# Patient Record
Sex: Female | Born: 1990 | Race: White | Hispanic: No | Marital: Married | Smoking: Current every day smoker
Health system: Southern US, Community
[De-identification: ages and names within clinical notes are randomized; demographics above are authoritative.]

## PROBLEM LIST (undated history)

## (undated) ENCOUNTER — Inpatient Hospital Stay (HOSPITAL_COMMUNITY): Payer: Self-pay

## (undated) DIAGNOSIS — N750 Cyst of Bartholin's gland: Secondary | ICD-10-CM

## (undated) DIAGNOSIS — R51 Headache: Secondary | ICD-10-CM

## (undated) DIAGNOSIS — IMO0002 Reserved for concepts with insufficient information to code with codable children: Secondary | ICD-10-CM

## (undated) DIAGNOSIS — M549 Dorsalgia, unspecified: Secondary | ICD-10-CM

## (undated) HISTORY — PX: OTHER SURGICAL HISTORY: SHX169

## (undated) HISTORY — PX: EYE SURGERY: SHX253

---
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
## 2010-03-15 ENCOUNTER — Inpatient Hospital Stay (HOSPITAL_COMMUNITY): Admission: AD | Admit: 2010-03-15 | Discharge: 2010-03-16 | Payer: Self-pay | Admitting: Obstetrics & Gynecology

## 2010-03-23 ENCOUNTER — Emergency Department (HOSPITAL_COMMUNITY): Admission: EM | Admit: 2010-03-23 | Discharge: 2010-03-23 | Payer: Self-pay | Admitting: Emergency Medicine

## 2010-04-05 ENCOUNTER — Emergency Department (HOSPITAL_COMMUNITY): Admission: EM | Admit: 2010-04-05 | Discharge: 2010-04-05 | Payer: Self-pay | Admitting: Family Medicine

## 2010-07-04 ENCOUNTER — Emergency Department (HOSPITAL_COMMUNITY)
Admission: EM | Admit: 2010-07-04 | Discharge: 2010-07-04 | Payer: Self-pay | Source: Home / Self Care | Admitting: Family Medicine

## 2010-07-31 NOTE — Assessment & Plan Note (Signed)
Summary: F/U,MC   Vital Signs:  Patient profile:   20 year old female BP sitting:   117 / 78  History of Present Illness: Reports to f/u left ankle sprain. Significantly decreased pain and swelling since her LOV. Minimal pain on ambulation; relieved by rest. Still some antero-lateral ttp of the left ankle. Otherwise no change in ROS. Self dc'ed CAM walker given significant improvement. No questions or concerns.  Allergies: 1)  ! Vicodin  Physical Exam  General:  Well-developed,well-nourished,in no acute distress; alert,appropriate and cooperative throughout examination Msk:  Full ROM/strength.  ANKLES/FEET: No discoloration, swelling, or apparent deformity. Significantly improved gross ROM wrt left ankle. Significantly decreased ttp of left ankle. No left malleolar ttp. Full strength without give-away weakness. No ligamentous instability.  (-) kleiger on left.  (-) squeeze test left.  Able to heel raise and toe-walk. Not favoring LLE on ambulation. Normal nv examination.   Impression & Recommendations:  Problem # 1:  ANKLE SPRAIN, LEFT (ICD-845.00) Assessment Improved Significantly improved.  - Continue ambulation without CAM walker. - Add daily heel raises, pidgeon-toe heel raises, and proprioception exercises as tolerated. - As condition continues to improve, can start recumbent biking +/- elliptical before trying to run. - OTC NSAID/acetaminophen per package instructions as needed for pain or swelling. - Ice ankle 20 mins two times a day as needed for pain or swelling. - RTC as needed for persistent symptoms or any other concerns.  Her updated medication list for this problem includes:    Mobic 15 Mg Tabs (Meloxicam) .Marland Kitchen... 1 tab by mouth with food daily  Complete Medication List: 1)  Mobic 15 Mg Tabs (Meloxicam) .Marland Kitchen.. 1 tab by mouth with food daily

## 2010-07-31 NOTE — Assessment & Plan Note (Signed)
Summary: NP,ANKLE FRACTURE,MC   Vital Signs:  Patient profile:   20 year old female Height:      64 inches Weight:      130 pounds BMI:     22.40 BP sitting:   115 / 69  Vitals Entered By: Lillia Pauls CMA (September 22, 2009 9:26 AM)  History of Present Illness: Inversion injury to left ankle while landing during basketball game 1 week ago. No pop/tear sensation. Evaluated at urgent care where x-rays showed acute vs. chronic fx, questionably of talus. Patient does not history of lateral [place] - crutches for 4 wks, then air cast for  ~4 weeks, then ASO brace. Does not recall exact location/type of fracture. No surgical mgmt. Placed in CAM walker after last week's injury. No analgesics/NSAID. No paresthesias.  Allergies (verified): 1)  ! Vicodin PMH-FH-SH reviewed for relevance  Physical Exam  General:  Well-developed,well-nourished,in no acute distress; alert,appropriate and cooperative throughout examination Msk:  KNEES: Full ROM/strength.  ANKLES/FEET: No discoloration or apparent deformity. Slight diffuse swelling. Mild gross limitation of left ankle ROM 2/2 pain. Diffuse ttp mostly over ATF distribution. No mall ttp. Full strength with giveaway weakness 2/2 pain on left. No ligamentous instability though patient guarding. (-) kleiger on left. Mildly (+) squeeze test left.  Able to heel raise and toe-walk.  Normal nv examination.    Impression & Recommendations:  Problem # 1:  ANKLE SPRAIN, LEFT (ICD-845.00) x-rays reveal avulsion fx possibly of 2/2 previous injury.  - Continue CAM walker for ambulatory activities. - Out of CAM 2-3 times daily for performance of ankle ROM exercises which were demonstrated to the patient during this encounter. - Mobic. - RTC in 3 weeks. Immediately seek MD attention for persistent pain, worsening swelling, paresthesias, or any other concerns.   Her updated medication list for this problem  includes:    Mobic 15 Mg Tabs (Meloxicam) .Marland Kitchen... 1 tab by mouth with food daily  Complete Medication List: 1)  Mobic 15 Mg Tabs (Meloxicam) .Marland Kitchen.. 1 tab by mouth with food daily Prescriptions: MOBIC 15 MG TABS (MELOXICAM) 1 tab by mouth with food daily  #14 x 0   Entered and Authorized by:   Valarie Merino MD   Signed by:   Valarie Merino MD on 09/22/2009   Method used:   Print then Give to Patient   RxID:   [id number] MOBIC 15 MG TABS (MELOXICAM) 1 tab by mouth with food daily  #14 x 0   Entered and Authorized by:   Valarie Merino MD   Signed by:   Valarie Merino MD on 09/22/2009   Method used:   Print then Give to Patient   RxID:   [id number]

## 2010-09-13 LAB — CBC
Hemoglobin: 14 g/dL (ref 12.0–15.0)
Platelets: 250 10*3/uL (ref 150–400)
RBC: 4.63 MIL/uL (ref 3.87–5.11)
WBC: 10 10*3/uL (ref 4.0–10.5)

## 2010-09-13 LAB — URINALYSIS, ROUTINE W REFLEX MICROSCOPIC
Bilirubin Urine: NEGATIVE
Bilirubin Urine: NEGATIVE
Glucose, UA: NEGATIVE mg/dL
Glucose, UA: NEGATIVE mg/dL
Ketones, ur: NEGATIVE mg/dL
Ketones, ur: NEGATIVE mg/dL
Protein, ur: NEGATIVE mg/dL
Protein, ur: NEGATIVE mg/dL
pH: 6 (ref 5.0–8.0)

## 2010-09-13 LAB — WET PREP, GENITAL
Trich, Wet Prep: NONE SEEN
Yeast Wet Prep HPF POC: NONE SEEN

## 2010-09-13 LAB — URINE MICROSCOPIC-ADD ON

## 2010-09-13 LAB — GC/CHLAMYDIA PROBE AMP, GENITAL: GC Probe Amp, Genital: NEGATIVE

## 2010-09-13 LAB — POCT URINALYSIS DIPSTICK
Glucose, UA: NEGATIVE mg/dL
Hgb urine dipstick: NEGATIVE
Nitrite: NEGATIVE
Protein, ur: NEGATIVE mg/dL
Specific Gravity, Urine: <=1.005 (ref 1.005–1.030)
Urobilinogen, UA: 0.2 mg/dL (ref 0.0–1.0)

## 2010-09-13 LAB — POCT PREGNANCY, URINE
Preg Test, Ur: NEGATIVE
Preg Test, Ur: NEGATIVE
Preg Test, Ur: NEGATIVE

## 2010-09-17 LAB — URINALYSIS, ROUTINE W REFLEX MICROSCOPIC
Glucose, UA: NEGATIVE mg/dL
Ketones, ur: NEGATIVE mg/dL
Protein, ur: NEGATIVE mg/dL
Urobilinogen, UA: 0.2 mg/dL (ref 0.0–1.0)

## 2011-01-23 ENCOUNTER — Emergency Department (HOSPITAL_COMMUNITY)
Admission: EM | Admit: 2011-01-23 | Discharge: 2011-01-24 | Disposition: A | Discharge status: Home / Self Care | Payer: Self-pay | Attending: Emergency Medicine | Admitting: Emergency Medicine

## 2011-01-23 DIAGNOSIS — R209 Unspecified disturbances of skin sensation: Secondary | ICD-10-CM | POA: Insufficient documentation

## 2011-01-23 DIAGNOSIS — E876 Hypokalemia: Secondary | ICD-10-CM | POA: Insufficient documentation

## 2011-01-23 LAB — DIFFERENTIAL
Basophils Absolute: 0.1 10*3/uL (ref 0.0–0.1)
Eosinophils Relative: 8 % — ABNORMAL HIGH (ref 0–5)
Lymphocytes Relative: 30 % (ref 12–46)
Lymphs Abs: 3.4 10*3/uL (ref 0.7–4.0)
Monocytes Absolute: 0.7 10*3/uL (ref 0.1–1.0)

## 2011-01-23 LAB — CBC
HCT: 39.7 % (ref 36.0–46.0)
MCHC: 34.8 g/dL (ref 30.0–36.0)
MCV: 87.4 fL (ref 78.0–100.0)
RDW: 12.4 % (ref 11.5–15.5)

## 2011-01-24 ENCOUNTER — Emergency Department (HOSPITAL_COMMUNITY): Payer: Self-pay

## 2011-01-24 LAB — BASIC METABOLIC PANEL
BUN: 8 mg/dL (ref 6–23)
Calcium: 9.2 mg/dL (ref 8.4–10.5)
GFR calc non Af Amer: >60 mL/min (ref >60)
Glucose, Bld: 99 mg/dL (ref 70–99)

## 2011-01-24 LAB — URINALYSIS, ROUTINE W REFLEX MICROSCOPIC
Bilirubin Urine: NEGATIVE
Hgb urine dipstick: NEGATIVE
Protein, ur: NEGATIVE mg/dL
Urobilinogen, UA: 0.2 mg/dL (ref 0.0–1.0)

## 2011-05-08 ENCOUNTER — Other Ambulatory Visit: Payer: Self-pay | Admitting: Neurosurgery

## 2011-05-09 ENCOUNTER — Encounter (HOSPITAL_COMMUNITY): Payer: Self-pay | Admitting: Pharmacy Technician

## 2011-05-10 ENCOUNTER — Encounter (HOSPITAL_COMMUNITY)
Admission: RE | Admit: 2011-05-10 | Discharge: 2011-05-10 | Disposition: A | Discharge status: Home / Self Care | Payer: Commercial Managed Care - PPO | Source: Ambulatory Visit | Attending: Neurosurgery | Admitting: Neurosurgery

## 2011-05-10 ENCOUNTER — Encounter (HOSPITAL_COMMUNITY): Payer: Self-pay

## 2011-05-10 HISTORY — DX: Headache: R51

## 2011-05-10 LAB — CBC
HCT: 41 % (ref 36.0–46.0)
Hemoglobin: 14 g/dL (ref 12.0–15.0)
MCH: 30.5 pg (ref 26.0–34.0)
MCHC: 34.1 g/dL (ref 30.0–36.0)
RBC: 4.59 MIL/uL (ref 3.87–5.11)

## 2011-05-10 LAB — BASIC METABOLIC PANEL
BUN: 7 mg/dL (ref 6–23)
CO2: 26 mEq/L (ref 19–32)
Calcium: 9.7 mg/dL (ref 8.4–10.5)
GFR calc non Af Amer: >90 mL/min (ref >90)
Glucose, Bld: 85 mg/dL (ref 70–99)
Potassium: 4.2 mEq/L (ref 3.5–5.1)
Sodium: 142 mEq/L (ref 135–145)

## 2011-05-10 LAB — SURGICAL PCR SCREEN
MRSA, PCR: NEGATIVE
Staphylococcus aureus: NEGATIVE

## 2011-05-10 LAB — TYPE AND SCREEN
ABO/RH(D): O POS
Antibody Screen: NEGATIVE

## 2011-05-10 LAB — ABO/RH: ABO/RH(D): O POS

## 2011-05-10 NOTE — Pre-Procedure Instructions (Signed)
20 Ladena L Pascal  05/10/2011   Your procedure is scheduled on: Monday 05/13/11   Report to [facility] at 530 AM.  Call this number if you have problems the morning of surgery: [phone]   Remember:   Do not eat food:After Midnight.  Do not drink clear liquids: 4 Hours before arrival.  Take these medicines the morning of surgery with A SIP OF WATER: EYE DROPS   Do not wear jewelry, make-up or nail polish.  Do not wear lotions, powders, or perfumes. You may wear deodorant.  Do not shave 48 hours prior to surgery.  Do not bring valuables to the hospital.  Contacts, dentures or bridgework may not be worn into surgery.  Leave suitcase in the car. After surgery it may be brought to your room.  For patients admitted to the hospital, checkout time is 11:00 AM the day of discharge.   Patients discharged the day of surgery will not be allowed to drive home.  Name and phone number of your driver:   AMY KESSLER   Special Instructions: CHG Shower Use Special Wash: 1/2 bottle night before surgery and 1/2 bottle morning of surgery.   Please read over the following fact sheets that you were given: Pain Booklet, MRSA Information and Surgical Site Infection Prevention   STOP ASPIRIN, COUMADIN, PLAVIX, EFFIENT, HERBAL MEDS

## 2011-05-12 MED ORDER — CEFAZOLIN SODIUM 1-5 GM-% IV SOLN
1.0000 g | INTRAVENOUS | Status: DC
  Filled 2011-05-12: qty 50

## 2011-05-12 MED ORDER — CEFAZOLIN SODIUM-DEXTROSE 2-3 GM-% IV SOLR
2.0000 g | INTRAVENOUS | Status: AC
  Administered 2011-05-13: 2 g via INTRAVENOUS
  Filled 2011-05-12: qty 50

## 2011-05-13 ENCOUNTER — Inpatient Hospital Stay (HOSPITAL_COMMUNITY): Payer: Commercial Managed Care - PPO | Admitting: Certified Registered"

## 2011-05-13 ENCOUNTER — Encounter (HOSPITAL_COMMUNITY): Payer: Self-pay | Admitting: Certified Registered"

## 2011-05-13 ENCOUNTER — Inpatient Hospital Stay (HOSPITAL_COMMUNITY)
Admission: RE | Admit: 2011-05-13 | Discharge: 2011-05-17 | DRG: 027 | Disposition: A | Discharge status: Home / Self Care | Payer: Commercial Managed Care - PPO | Source: Ambulatory Visit | Attending: Neurosurgery | Admitting: Neurosurgery

## 2011-05-13 ENCOUNTER — Encounter (HOSPITAL_COMMUNITY): Payer: Self-pay | Admitting: Neurology

## 2011-05-13 ENCOUNTER — Encounter (HOSPITAL_COMMUNITY): Payer: Self-pay | Admitting: *Deleted

## 2011-05-13 ENCOUNTER — Encounter (HOSPITAL_COMMUNITY)
Admission: RE | Disposition: A | Discharge status: Home / Self Care | Payer: Self-pay | Source: Ambulatory Visit | Attending: Neurosurgery

## 2011-05-13 DIAGNOSIS — R209 Unspecified disturbances of skin sensation: Secondary | ICD-10-CM | POA: Diagnosis present

## 2011-05-13 DIAGNOSIS — H9190 Unspecified hearing loss, unspecified ear: Secondary | ICD-10-CM | POA: Diagnosis present

## 2011-05-13 DIAGNOSIS — Z01812 Encounter for preprocedural laboratory examination: Secondary | ICD-10-CM

## 2011-05-13 DIAGNOSIS — F329 Major depressive disorder, single episode, unspecified: Secondary | ICD-10-CM | POA: Diagnosis present

## 2011-05-13 DIAGNOSIS — F3289 Other specified depressive episodes: Secondary | ICD-10-CM | POA: Diagnosis present

## 2011-05-13 DIAGNOSIS — R51 Headache: Secondary | ICD-10-CM | POA: Diagnosis present

## 2011-05-13 DIAGNOSIS — R112 Nausea with vomiting, unspecified: Secondary | ICD-10-CM | POA: Diagnosis not present

## 2011-05-13 DIAGNOSIS — G935 Compression of brain: Principal | ICD-10-CM | POA: Diagnosis present

## 2011-05-13 HISTORY — PX: SUBOCCIPITAL CRANIECTOMY CERVICAL LAMINECTOMY: SHX5404

## 2011-05-13 SURGERY — SUBOCCIPITAL CRANIECTOMY CERVICAL LAMINECTOMY/DURAPLASTY
Anesthesia: General

## 2011-05-13 MED ORDER — ONDANSETRON HCL 4 MG/2ML IJ SOLN
4.0000 mg | INTRAMUSCULAR | Status: DC | PRN
  Administered 2011-05-13 – 2011-05-17 (×3): 4 mg via INTRAVENOUS
  Filled 2011-05-13 (×2): qty 2

## 2011-05-13 MED ORDER — LIDOCAINE-EPINEPHRINE 0.5-1:200000 % IJ SOLN
INTRAMUSCULAR | Status: DC | PRN
  Administered 2011-05-13: 10 mL via INTRADERMAL

## 2011-05-13 MED ORDER — NEOSTIGMINE METHYLSULFATE 1 MG/ML IJ SOLN
INTRAMUSCULAR | Status: DC | PRN
  Administered 2011-05-13: 4 mg via INTRAVENOUS

## 2011-05-13 MED ORDER — ONDANSETRON HCL 4 MG/2ML IJ SOLN
INTRAMUSCULAR | Status: AC
  Filled 2011-05-13: qty 2

## 2011-05-13 MED ORDER — SUFENTANIL CITRATE 50 MCG/ML IV SOLN
INTRAVENOUS | Status: DC | PRN
  Administered 2011-05-13: 10 ug via INTRAVENOUS
  Administered 2011-05-13: 20 ug via INTRAVENOUS
  Administered 2011-05-13 (×3): 10 ug via INTRAVENOUS

## 2011-05-13 MED ORDER — DEXAMETHASONE SODIUM PHOSPHATE 4 MG/ML IJ SOLN
4.0000 mg | Freq: Four times a day (QID) | INTRAMUSCULAR | Status: DC
  Administered 2011-05-13 – 2011-05-15 (×2): 4 mg via INTRAVENOUS
  Filled 2011-05-13 (×9): qty 1

## 2011-05-13 MED ORDER — DIAZEPAM 5 MG PO TABS
5.0000 mg | ORAL_TABLET | Freq: Four times a day (QID) | ORAL | Status: DC | PRN
  Administered 2011-05-14 – 2011-05-17 (×8): 5 mg via ORAL
  Filled 2011-05-13 (×8): qty 1

## 2011-05-13 MED ORDER — OXYCODONE-ACETAMINOPHEN 5-325 MG PO TABS
1.0000 | ORAL_TABLET | ORAL | Status: DC | PRN
  Administered 2011-05-13 – 2011-05-16 (×10): 2 via ORAL
  Administered 2011-05-16: 1 via ORAL
  Administered 2011-05-17 (×2): 2 via ORAL
  Filled 2011-05-13 (×13): qty 2

## 2011-05-13 MED ORDER — DEXAMETHASONE 4 MG PO TABS
4.0000 mg | ORAL_TABLET | Freq: Four times a day (QID) | ORAL | Status: DC
  Administered 2011-05-13 – 2011-05-15 (×7): 4 mg via ORAL
  Filled 2011-05-13 (×12): qty 1

## 2011-05-13 MED ORDER — ACETAMINOPHEN 325 MG PO TABS: 650.0000 mg | ORAL_TABLET | ORAL | Status: DC | PRN

## 2011-05-13 MED ORDER — ONDANSETRON HCL 4 MG/2ML IJ SOLN
INTRAMUSCULAR | Status: DC | PRN
  Administered 2011-05-13: 4 mg via INTRAVENOUS

## 2011-05-13 MED ORDER — ONDANSETRON HCL 4 MG/2ML IJ SOLN: 4.0000 mg | Freq: Four times a day (QID) | INTRAMUSCULAR | Status: DC | PRN

## 2011-05-13 MED ORDER — ACETAMINOPHEN 650 MG RE SUPP: 650.0000 mg | RECTAL | Status: DC | PRN

## 2011-05-13 MED ORDER — BACITRACIN-NEOMYCIN-POLYMYXIN OINTMENT TUBE
TOPICAL_OINTMENT | CUTANEOUS | Status: DC | PRN
  Administered 2011-05-13: 1 via TOPICAL

## 2011-05-13 MED ORDER — HYDROMORPHONE HCL PF 1 MG/ML IJ SOLN: 0.2500 mg | INTRAMUSCULAR | Status: DC | PRN

## 2011-05-13 MED ORDER — KETOROLAC TROMETHAMINE 30 MG/ML IJ SOLN
30.0000 mg | Freq: Four times a day (QID) | INTRAMUSCULAR | Status: AC
  Administered 2011-05-13 – 2011-05-15 (×8): 30 mg via INTRAVENOUS
  Filled 2011-05-13 (×11): qty 1

## 2011-05-13 MED ORDER — ROCURONIUM BROMIDE 100 MG/10ML IV SOLN
INTRAVENOUS | Status: DC | PRN
  Administered 2011-05-13 (×2): 5 mg via INTRAVENOUS
  Administered 2011-05-13: 50 mg via INTRAVENOUS
  Administered 2011-05-13 (×2): 10 mg via INTRAVENOUS

## 2011-05-13 MED ORDER — DEXAMETHASONE SODIUM PHOSPHATE 10 MG/ML IJ SOLN
INTRAMUSCULAR | Status: DC | PRN
  Administered 2011-05-13: 10 mg via INTRAVENOUS

## 2011-05-13 MED ORDER — HETASTARCH-ELECTROLYTES 6 % IV SOLN
INTRAVENOUS | Status: DC | PRN
  Administered 2011-05-13: 11:00:00 via INTRAVENOUS

## 2011-05-13 MED ORDER — INFLUENZA VIRUS VACC SPLIT PF IM SUSP
0.5000 mL | INTRAMUSCULAR | Status: AC
  Administered 2011-05-14: 0.5 mL via INTRAMUSCULAR
  Filled 2011-05-13: qty 0.5

## 2011-05-13 MED ORDER — PROPOFOL 10 MG/ML IV EMUL
INTRAVENOUS | Status: DC | PRN
  Administered 2011-05-13: 200 mg via INTRAVENOUS

## 2011-05-13 MED ORDER — LIDOCAINE HCL (CARDIAC) 20 MG/ML IV SOLN
INTRAVENOUS | Status: DC | PRN
  Administered 2011-05-13: 60 mg via INTRAVENOUS

## 2011-05-13 MED ORDER — MORPHINE SULFATE 4 MG/ML IJ SOLN
1.0000 mg | INTRAMUSCULAR | Status: DC | PRN
  Administered 2011-05-13 (×2): 2 mg via INTRAVENOUS
  Administered 2011-05-14 (×6): 4 mg via INTRAVENOUS
  Administered 2011-05-15: 2 mg via INTRAVENOUS
  Administered 2011-05-15: 4 mg via INTRAVENOUS
  Filled 2011-05-13 (×10): qty 1

## 2011-05-13 MED ORDER — SODIUM CHLORIDE 0.9 % IJ SOLN
3.0000 mL | INTRAMUSCULAR | Status: DC | PRN
  Administered 2011-05-15: 3 mL via INTRAVENOUS

## 2011-05-13 MED ORDER — DOCUSATE SODIUM 100 MG PO CAPS
100.0000 mg | ORAL_CAPSULE | Freq: Two times a day (BID) | ORAL | Status: DC
  Administered 2011-05-13 – 2011-05-17 (×7): 100 mg via ORAL
  Filled 2011-05-13 (×9): qty 1

## 2011-05-13 MED ORDER — SODIUM CHLORIDE 0.9 % IV SOLN: 250.0000 mL | INTRAVENOUS | Status: DC

## 2011-05-13 MED ORDER — GLYCOPYRROLATE 0.2 MG/ML IJ SOLN
INTRAMUSCULAR | Status: DC | PRN
  Administered 2011-05-13: .6 mg via INTRAVENOUS

## 2011-05-13 MED ORDER — PREDNISOLONE ACETATE 1 % OP SUSP
1.0000 [drp] | Freq: Three times a day (TID) | OPHTHALMIC | Status: DC
  Administered 2011-05-15 – 2011-05-16 (×2): 1 [drp] via OPHTHALMIC
  Filled 2011-05-13 (×2): qty 1

## 2011-05-13 MED ORDER — SODIUM CHLORIDE 0.9 % IV SOLN
10000.0000 ug | INTRAVENOUS | Status: DC | PRN
  Administered 2011-05-13: 40 ug/min via INTRAVENOUS

## 2011-05-13 MED ORDER — MIDAZOLAM HCL 5 MG/5ML IJ SOLN
INTRAMUSCULAR | Status: DC | PRN
  Administered 2011-05-13: 2 mg via INTRAVENOUS

## 2011-05-13 MED ORDER — MICROFIBRILLAR COLL HEMOSTAT EX PADS: MEDICATED_PAD | CUTANEOUS | Status: DC | PRN

## 2011-05-13 MED ORDER — POTASSIUM CHLORIDE IN NACL 20-0.9 MEQ/L-% IV SOLN
INTRAVENOUS | Status: DC
  Administered 2011-05-13: 15:00:00 via INTRAVENOUS
  Filled 2011-05-13 (×4): qty 1000

## 2011-05-13 MED ORDER — THROMBIN 20000 UNITS EX KIT
PACK | CUTANEOUS | Status: DC | PRN
  Administered 2011-05-13: 20000 [IU] via TOPICAL

## 2011-05-13 MED ORDER — SODIUM CHLORIDE 0.9 % IV SOLN
INTRAVENOUS | Status: DC | PRN
  Administered 2011-05-13: 07:00:00 via INTRAVENOUS

## 2011-05-13 MED ORDER — SODIUM CHLORIDE 0.9 % IJ SOLN
3.0000 mL | Freq: Two times a day (BID) | INTRAMUSCULAR | Status: DC
  Administered 2011-05-13 – 2011-05-17 (×8): 3 mL via INTRAVENOUS

## 2011-05-13 MED ORDER — LACTATED RINGERS IV SOLN
INTRAVENOUS | Status: DC | PRN
  Administered 2011-05-13: 07:00:00 via INTRAVENOUS

## 2011-05-13 SURGICAL SUPPLY — 84 items
APL SKNCLS STERI-STRIP NONHPOA (GAUZE/BANDAGES/DRESSINGS)
BENZOIN TINCTURE PRP APPL 2/3 (GAUZE/BANDAGES/DRESSINGS) IMPLANT
BLADE SURG ROTATE 9660 (MISCELLANEOUS) ×3 IMPLANT
BLADE ULTRA TIP 2M (BLADE) ×1 IMPLANT
BRUSH SCRUB EZ 1% IODOPHOR (MISCELLANEOUS) ×1 IMPLANT
BUR ACORN 6.0 PRECISION (BURR) ×2 IMPLANT
CANISTER SUCTION 2500CC (MISCELLANEOUS) ×2 IMPLANT
CLIP TI MEDIUM 6 (CLIP) ×2 IMPLANT
CLOTH BEACON ORANGE TIMEOUT ST (SAFETY) ×2 IMPLANT
CONT SPEC 4OZ CLIKSEAL STRL BL (MISCELLANEOUS) ×2 IMPLANT
CORDS BIPOLAR (ELECTRODE) ×2 IMPLANT
COVER TABLE BACK 60X90 (DRAPES) IMPLANT
DECANTER SPIKE VIAL GLASS SM (MISCELLANEOUS) ×2 IMPLANT
DRAIN SNY WOU 7FLT (WOUND CARE) IMPLANT
DRAPE LAPAROTOMY 100X72 PEDS (DRAPES) ×2 IMPLANT
DRAPE MICROSCOPE LEICA (MISCELLANEOUS) ×1 IMPLANT
DRAPE WARM FLUID 44X44 (DRAPE) ×2 IMPLANT
DRESSING TELFA 8X3 (GAUZE/BANDAGES/DRESSINGS) ×2 IMPLANT
DURAGUARD 04CMX04CM ×1 IMPLANT
DURAPREP 6ML APPLICATOR 50/CS (WOUND CARE) ×2 IMPLANT
ELECT CAUTERY BLADE 6.4 (BLADE) ×2 IMPLANT
ELECT REM PT RETURN 9FT ADLT (ELECTROSURGICAL) ×2
ELECTRODE REM PT RTRN 9FT ADLT (ELECTROSURGICAL) ×1 IMPLANT
EVACUATOR 1/8 PVC DRAIN (DRAIN) IMPLANT
EVACUATOR SILICONE 100CC (DRAIN) IMPLANT
GAUZE SPONGE 4X4 16PLY XRAY LF (GAUZE/BANDAGES/DRESSINGS) IMPLANT
GLOVE BIO SURGEON STRL SZ 6.5 (GLOVE) IMPLANT
GLOVE BIO SURGEON STRL SZ7 (GLOVE) IMPLANT
GLOVE BIO SURGEON STRL SZ7.5 (GLOVE) IMPLANT
GLOVE BIO SURGEON STRL SZ8 (GLOVE) IMPLANT
GLOVE BIO SURGEON STRL SZ8.5 (GLOVE) ×1 IMPLANT
GLOVE BIOGEL M 8.0 STRL (GLOVE) IMPLANT
GLOVE ECLIPSE 6.5 STRL STRAW (GLOVE) ×2 IMPLANT
GLOVE ECLIPSE 7.0 STRL STRAW (GLOVE) IMPLANT
GLOVE ECLIPSE 7.5 STRL STRAW (GLOVE) IMPLANT
GLOVE ECLIPSE 8.0 STRL XLNG CF (GLOVE) IMPLANT
GLOVE ECLIPSE 8.5 STRL (GLOVE) IMPLANT
GLOVE EXAM NITRILE LRG STRL (GLOVE) IMPLANT
GLOVE EXAM NITRILE MD LF STRL (GLOVE) ×1 IMPLANT
GLOVE EXAM NITRILE XL STR (GLOVE) IMPLANT
GLOVE EXAM NITRILE XS STR PU (GLOVE) IMPLANT
GLOVE INDICATOR 6.5 STRL GRN (GLOVE) IMPLANT
GLOVE INDICATOR 7.0 STRL GRN (GLOVE) IMPLANT
GLOVE INDICATOR 7.5 STRL GRN (GLOVE) IMPLANT
GLOVE INDICATOR 8.0 STRL GRN (GLOVE) IMPLANT
GLOVE INDICATOR 8.5 STRL (GLOVE) IMPLANT
GLOVE OPTIFIT SS 8.0 STRL (GLOVE) ×1 IMPLANT
GLOVE OPTIFIT SS STER SZ 7 (GLOVE) IMPLANT
GLOVE SURG SS PI 6.5 STRL IVOR (GLOVE) IMPLANT
GOWN BRE IMP SLV AUR LG STRL (GOWN DISPOSABLE) ×2 IMPLANT
GOWN BRE IMP SLV AUR XL STRL (GOWN DISPOSABLE) ×1 IMPLANT
GOWN STRL REIN 2XL LVL4 (GOWN DISPOSABLE) ×2 IMPLANT
HEMOSTAT SURGICEL 2X14 (HEMOSTASIS) IMPLANT
KIT BASIN OR (CUSTOM PROCEDURE TRAY) ×2 IMPLANT
KIT ROOM TURNOVER OR (KITS) ×2 IMPLANT
NDL HYPO 25X1 1.5 SAFETY (NEEDLE) ×1 IMPLANT
NEEDLE HYPO 25X1 1.5 SAFETY (NEEDLE) ×2 IMPLANT
NS IRRIG 1000ML POUR BTL (IV SOLUTION) ×4 IMPLANT
PACK CRANIOTOMY (CUSTOM PROCEDURE TRAY) ×2 IMPLANT
PAD ARMBOARD 7.5X6 YLW CONV (MISCELLANEOUS) ×6 IMPLANT
PATTIES SURGICAL .5 X.5 (GAUZE/BANDAGES/DRESSINGS) ×1 IMPLANT
PATTIES SURGICAL 1/4 X 3 (GAUZE/BANDAGES/DRESSINGS) IMPLANT
PIN MAYFIELD SKULL DISP (PIN) ×1 IMPLANT
RUBBERBAND STERILE (MISCELLANEOUS) IMPLANT
SPONGE GAUZE 4X4 12PLY (GAUZE/BANDAGES/DRESSINGS) ×1 IMPLANT
SPONGE LAP 4X18 X RAY DECT (DISPOSABLE) IMPLANT
SPONGE SURGIFOAM ABS GEL 100 (HEMOSTASIS) ×2 IMPLANT
STAPLER SKIN PROX WIDE 3.9 (STAPLE) ×1 IMPLANT
SUT ETHILON 3 0 FSL (SUTURE) ×1 IMPLANT
SUT NURALON 4 0 TR CR/8 (SUTURE) ×3 IMPLANT
SUT PROLENE 6 0 BV (SUTURE) ×1 IMPLANT
SUT VIC AB 0 CT1 18XCR BRD8 (SUTURE) ×1 IMPLANT
SUT VIC AB 0 CT1 8-18 (SUTURE) ×6
SUT VIC AB 2-0 CT2 18 VCP726D (SUTURE) ×2 IMPLANT
SUT VIC AB 3-0 SH 8-18 (SUTURE) ×1 IMPLANT
SYR 20ML ECCENTRIC (SYRINGE) ×2 IMPLANT
SYR CONTROL 10ML LL (SYRINGE) ×2 IMPLANT
TAPE CLOTH SURG 4X10 WHT LF (GAUZE/BANDAGES/DRESSINGS) ×1 IMPLANT
TOWEL GREEN STERILE 4/PKG (MISCELLANEOUS) IMPLANT
TOWEL OR 17X24 6PK STRL BLUE (TOWEL DISPOSABLE) ×1 IMPLANT
TOWEL OR 17X26 10 PK STRL BLUE (TOWEL DISPOSABLE) ×2 IMPLANT
TRAY FOLEY CATH 14FRSI W/METER (CATHETERS) IMPLANT
UNDERPAD 30X30 INCONTINENT (UNDERPADS AND DIAPERS) IMPLANT
WATER STERILE IRR 1000ML POUR (IV SOLUTION) ×2 IMPLANT

## 2011-05-13 NOTE — Transfer of Care (Signed)
Immediate Anesthesia Transfer of Care Note  Patient: Taylor Garcia  Procedure(s) Performed:  SUBOCCIPITAL CRANIECTOMY CERVICAL LAMINECTOMY/DURAPLASTY - CHIARI DECOMPRESSION  Patient Location: PACU  Anesthesia Type: General  Level of Consciousness: patient cooperative, lethargic and responds to stimulation  Airway & Oxygen Therapy: Patient Spontanous Breathing and Patient connected to face mask oxygen  Post-op Assessment: Report given to PACU RN and Post -op Vital signs reviewed and stable  Post vital signs: stable  Complications: No apparent anesthesia complications

## 2011-05-13 NOTE — Anesthesia Postprocedure Evaluation (Signed)
Anesthesia Post Note  Patient: Taylor Garcia  Procedure(s) Performed:  SUBOCCIPITAL CRANIECTOMY CERVICAL LAMINECTOMY/DURAPLASTY - CHIARI DECOMPRESSION  Anesthesia type: General  Patient location: PACU  Post pain: Pain level controlled and Adequate analgesia  Post assessment: Post-op Vital signs reviewed, Patient's Cardiovascular Status Stable, Respiratory Function Stable, Patent Airway and Pain level controlled  Last Vitals:  Filed Vitals:   05/13/11 1315  BP: 112/57  Pulse: 97  Temp:   Resp: 27    Post vital signs: Reviewed and stable  Level of consciousness: awake, alert  and oriented  Complications: No apparent anesthesia complications

## 2011-05-13 NOTE — Op Note (Signed)
05/13/2011  12:28 PM  PATIENT:  Taylor Garcia  20 y.o. female  PRE-OPERATIVE DIAGNOSIS:  CHIARI MALFORMATION  POST-OPERATIVE DIAGNOSIS:  Chiari Malformation  PROCEDURE:  Procedure(s): SUBOCCIPITAL CRANIECTOMY CERVICAL LAMINECTOMY/DURAPLASTY  SURGEON:  Surgeon(s): Shanetha Bradham L Shanah Guimaraes Cristi Loron  PHYSICIAN ASSISTANT:  Tressie Stalker  ASSISTANTS: none   ANESTHESIA:   general  EBL:  Total I/O In: 1900 [I.V.:1400; IV Piggyback:500] Out: 450 [Urine:350; Blood:100]  BLOOD ADMINISTERED:none  DRAINS: none   LOCAL MEDICATIONS USED:  LIDOCAINE 10CC  SPECIMEN:  No Specimen  DISPOSITION OF SPECIMEN:  N/A  COUNTS:  YES  TOURNIQUET:  * No tourniquets in log *  DICTATION: Ms. Vernell Barrier was taken to the operating room and under a general anesthetic without difficulty. An arterial catheter was placed under sterile conditions. A Foley cath was placed under sterile conditions. After adequate anesthesia had been obtained, a Mayfield headholder was placed with approximately 80 pounds of pressure. She was rolled prone onto body rolls, and positioned with the chin tucked slightly. Her head was then shaved and prepped in a sterile fashion. I infiltrated from the suboccipital region to the upper cervical region with half percent lidocaine using 10 cc. I opened the skin with a #10 blade and took this down to the deep posterior cervical fascia. The midline dissection I was able to expose the lamina of L1 and of L2. I also expose the occiput to allow for adequate decompression. I placed self-retaining retractors. I used the drill to create Pollyann Kennedy the posterior fossa I completed the suboccipital craniectomy using Kerrison punches along with the drill and Leksell rongeur. I also completed a posterior laminectomy of C1 keeping it  as wide as the opening to the foramen magnum.   I opened the dura overlying the cerebellum bilaterally. Connected that in the middle and continued a midline incision rostrally  beyond the cerebellar tonsils. I then proceeded to sew a dural patch graft into position. I used a 6-0 prolene suture to sew the patch into place. I checked the closure by having anesthesia perform a valsalva. I did not appreciate a csf leak. I closed the wound in layered fashion with vicryl sutures, reapproximating the deep cervical fascia and subcutaneous tissure. I used a 3-0 nylon suture to reapproximate the skin edges. I applied a sterile dressings. Miss Keeran was rolled supine, I removed the mayfield without difficulty.  PLAN OF CARE: Admit to inpatient   PATIENT DISPOSITION:  ICU - extubated and stable.   Delay start of Pharmacological VTE agent (>24hrs) due to surgical blood loss or risk of bleeding:  {YES/NO/NOT APPLICABLE:20182

## 2011-05-13 NOTE — Preoperative (Signed)
Beta Blockers   Reason not to administer Beta Blockers:Not Applicable 

## 2011-05-13 NOTE — H&P (Signed)
Taylor Garcia is an 20 y.o. female.   Chief Complaint: Headache, neck pain, and numbness in the upper extremities. HPI: Taylor Garcia is a 20 year old who presented for evaluation of numbness in and around the shoulders along with numbness in the arms. She describes numbness in the car on the right side of her head phase ear neck arm and fingers shoulder and upper right back. She describes weakness and blurry eyes which started in August 2011. She describes weakness in the arm actively. She denies bowel bladder dysfunction but does urinate more frequently than she think is normal. She times as she loses control of her body, her eyes open blurry, her legs get weak and she can barely walk or speak.  Past Medical History  Diagnosis Date  . Headache     Past Surgical History  Procedure Date  . Eye surgery     AS CHILD   . Ears     TUBES IN EARS     History reviewed. No pertinent family history. Social History:  reports that she has been smoking.  She does not have any smokeless tobacco history on file. She reports that she does not drink alcohol or use illicit drugs.  Allergies:  Allergies  Allergen Reactions  . Coconut Oil Hives  . Hydrocodone-Acetaminophen Other (See Comments)    VICODIN Reaction:" vomits Blood"    Medications Prior to Admission  Medication Dose Route Frequency Provider Last Rate Last Dose  . ceFAZolin (ANCEF) IVPB 2 g/50 mL premix  2 g Intravenous 60 min Pre-Op Elwin Sleight, PHARMD      . DISCONTD: ceFAZolin (ANCEF) IVPB 1 g/50 mL premix  1 g Intravenous 60 min Pre-Op Raijon Lindfors L Petro Talent       Medications Prior to Admission  Medication Sig Dispense Refill  . prednisoLONE acetate (PRED FORTE) 1 % ophthalmic suspension Place 1 drop into the right eye 3 (three) times daily.          No results found for this or any previous visit (from the past 48 hour(s)). No results found.  Review of Systems  HENT: Positive for hearing loss and neck pain.   Cardiovascular:  Positive for chest pain.  Musculoskeletal: Positive for back pain.  Neurological: Positive for speech change and focal weakness.  Psychiatric/Behavioral: Positive for depression and memory loss. The patient is nervous/anxious.     Blood pressure 112/82, pulse 83, temperature 97.6 F (36.4 C), resp. rate 22, SpO2 99.00%. Physical Exam alert and oriented by for answer questions appropriately. Memory, language, attention stand and fund of knowledge are normal. She is well kept in no distress. She has normal strength in the upper and lower extremities. Muscle tone bulk and coordination are normal. Romberg test is negative. She has normal reflexes in the biceps triceps brachioradialis knees and ankles. Pupils are equal round and reactive to light. She has full extraocular movements. She has full visual fields. Facial sensation is decreased slightly on the right side to light touch. Facial movements are symmetric and equal. Hearing is intact to finger rub bilaterally. Uvula elevates in the midline. Shoulder shrug was normal. Temperatures in the midline. There are no cervical masses or bruits appreciated. Lung fields are clear. Heart regular rhythm and rate no murmurs or rubs are appreciated. Pulses are good at the wrist bilaterally. She has no abnormalities with the oromucosa. Head is normocephalic and atraumatic. ment/Plan Or FOR OCCIPITAL DECompression, possible C1 laminectomy.  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 I believe a suboccipital decompression and C1 laminectomy with a dural patch graft should relieve her symptoms. She has a large cervical syrinx which should also be treated either the suboccipital decompression. I believe the seriousness response of the numbness she has along with a Chiari. She's agreed to surgery and we will proceed. Alvah Gilder L 05/13/2011, 8:15 AM

## 2011-05-13 NOTE — Progress Notes (Signed)
Torodol 30mg  IV given in PACU per orders

## 2011-05-13 NOTE — Anesthesia Preprocedure Evaluation (Addendum)
Anesthesia Evaluation  Patient identified by MRN, date of birth, ID band Patient awake    Reviewed: Allergy & Precautions, H&P , NPO status , Patient's Chart, lab work & pertinent test results  Airway Mallampati: I TM Distance: >3 FB Neck ROM: Full    Dental  (+) Teeth Intact and Dental Advisory Given   Pulmonary  clear to auscultation        Cardiovascular     Neuro/Psych  Headaches,    GI/Hepatic   Endo/Other    Renal/GU      Musculoskeletal   Abdominal   Peds  Hematology   Anesthesia Other Findings   Reproductive/Obstetrics                          Anesthesia Physical Anesthesia Plan  ASA: II  Anesthesia Plan: General   Post-op Pain Management:    Induction: Intravenous  Airway Management Planned: Oral ETT  Additional Equipment:   Intra-op Plan:   Post-operative Plan:   Informed Consent: I have reviewed the patients History and Physical, chart, labs and discussed the procedure including the risks, benefits and alternatives for the proposed anesthesia with the patient or authorized representative who has indicated his/her understanding and acceptance.   Dental advisory given  Plan Discussed with: CRNA and Surgeon  Anesthesia Plan Comments:         Anesthesia Quick Evaluation

## 2011-05-13 NOTE — Progress Notes (Signed)
Subjective: Patient reports nausea, vomiting and pain at the surgical site.  Objective: Vital signs in last 24 hours: Temp:  [97.4 F (36.3 C)-98.1 F (36.7 C)] 97.4 F (36.3 C) (11/12 1406) Pulse Rate:  [83-112] 105  (11/12 1800) Resp:  [16-28] 22  (11/12 1800) BP: (96-117)/(47-82) 102/60 mmHg (11/12 1800) SpO2:  [92 %-99 %] 99 % (11/12 1800) Weight:  [82.5 kg (181 lb 14.1 oz)] 181 lb 14.1 oz (82.5 kg) (11/12 1406)  Intake/Output from previous day:   Intake/Output this shift:    Neurologic: Alert and oriented X 3, normal strength and tone. Normal symmetric reflexes. Normal coordination and gait Cranial nerves: II: visual acuity normal bilaterally, II: visual field normal, II: pupils equal, round, reactive to light and accommodation, VII: upper facial muscle function normal bilaterally, VIII: hearing normal, XII: tongue strength normal  Coordination: normal  Lab Results: No results found for this basename: WBC:2,HGB:2,HCT:2,PLT:2 in the last 72 hours BMET No results found for this basename: NA:2,K:2,CL:2,CO2:2,GLUCOSE:2,BUN:2,CREATININE:2,CALCIUM:2 in the last 72 hours  Studies/Results: No results found.  Assessment/Plan: S/p Suboccipital craniectomy and c1 laminectomy for Chiari. Has 1 episode emesis, feeling better now. Wound with sanguineous drainage, no signs infection.   LOS: 0 days  Bedrest through the night. Probable foley d/c tomorrow. Doing well currently.   Jalynn Betzold L 05/13/2011, 8:22 PM

## 2011-05-13 NOTE — Anesthesia Procedure Notes (Signed)
Procedure Name: Intubation Date/Time: 05/13/2011 8:58 AM Performed by: De Nurse Pre-anesthesia Checklist: Patient identified, Emergency Drugs available, Timeout performed, Suction available and Patient being monitored Patient Re-evaluated:Patient Re-evaluated prior to inductionOxygen Delivery Method: Circle System Utilized Preoxygenation: Pre-oxygenation with 100% oxygen Intubation Type: IV induction Ventilation: Mask ventilation without difficulty Laryngoscope Size: Mac and 4 Grade View: Grade I Tube type: Oral Tube size: 7.5 mm Number of attempts: 2 Airway Equipment and Method: stylet Placement Confirmation: ETT inserted through vocal cords under direct vision,  positive ETCO2,  CO2 detector and breath sounds checked- equal and bilateral Secured at: 23 cm Tube secured with: Tape Dental Injury: Teeth and Oropharynx as per pre-operative assessment  Comments: First insertion attempt did not have EtCO2, second insertion, grade I view with no difficulties and +EtCO2

## 2011-05-14 NOTE — Progress Notes (Signed)
Subjective: Patient reports pain at the site of her incision. Her nausea has resolved she's had no emesis since yesterday.  Objective: Vital signs in last 24 hours: Temp:  [97.4 F (36.3 C)-98.1 F (36.7 C)] 98.1 F (36.7 C) (11/13 0700) Pulse Rate:  [82-112] 94  (11/13 0900) Resp:  [15-28] 19  (11/13 0900) BP: (86-117)/(37-69) 105/66 mmHg (11/13 0900) SpO2:  [92 %-99 %] 97 % (11/13 0900) Weight:  [82.5 kg (181 lb 14.1 oz)] 181 lb 14.1 oz (82.5 kg) (11/12 1406)  Intake/Output from previous day: 11/12 0701 - 11/13 0700 In: 4428.7 [P.O.:685; I.V.:2798.7; IV Piggyback:500] Out: 1975 [Urine:1875; Blood:100] Intake/Output this shift: Total I/O In: 160 [I.V.:160] Out: 75 [Urine:75]  BP 105/66  Pulse 94  Temp(Src) 98.1 F (36.7 C) (Oral)  Resp 19  Ht 5\' 4"  (1.626 m)  Wt 82.5 kg (181 lb 14.1 oz)  BMI 31.22 kg/m2  SpO2 97% Alert, oriented, thought content appropriatenormal } bilateralno sensory deficits noted reflexes - biceps: 2+ bilaterally/2+ bilaterally, triceps: 2+ bilaterally/2+ bilaterally, patellar 2+ bilaterally/2+ bilaterally, ankle:: 2+ bilaterally/downgoing bilaterally and plantar response   Lab Results: No results found for this basename: WBC:2,HGB:2,HCT:2,PLT:2 in the last 72 hours BMET No results found for this basename: NA:2,K:2,CL:2,CO2:2,GLUCOSE:2,BUN:2,CREATININE:2,CALCIUM:2 in the last 72 hours  Studies/Results: No results found.  Assessment/Plan: I will DC the Foley today. I will also DC the arterial line. I will saline lock the IV. She will be allowed up into a chair with assistance appear will remain in the ICU for another day. Looks good. She's doing quite well postoperatively.  LOS: 1 day     Nadira Single L 05/14/2011, 9:29 AM

## 2011-05-15 NOTE — Progress Notes (Signed)
Utilization review completed. Krystyna Cleckley, RN, BSN. 05/15/11 

## 2011-05-15 NOTE — Progress Notes (Signed)
Subjective: Patient reports Improving appetite. ready to leave the ICU.  Objective: Vital signs in last 24 hours: Temp:  [97 F (36.1 C)-98.7 F (37.1 C)] 98.3 F (36.8 C) (11/14 1100) Pulse Rate:  [73-96] 85  (11/14 1200) Resp:  [15-21] 18  (11/14 1200) BP: (87-114)/(41-74) 99/41 mmHg (11/14 1200) SpO2:  [90 %-99 %] 97 % (11/14 1100)  Intake/Output from previous day: 11/13 0701 - 11/14 0700 In: 1850 [P.O.:1290; I.V.:560] Out: 450 [Urine:450] Intake/Output this shift: Total I/O In: 663 [P.O.:660; I.V.:3] Out: -   Neurologic: Mental status: Alert, oriented, thought content appropriate Cranial nerves: II: visual acuity normal bilaterally, II: visual field normal, II: pupils equal, round, reactive to light and accommodation, III,IV,VI: extraocular muscles extra-ocular motions intact, V: mastication normal, V: facial light touch sensation normal bilaterally, VII: upper facial muscle function normal bilaterally, VII: lower facial muscle function normal bilaterally, VIII: hearing normal, IX,X: gag reflex present, XI: trapezius strength normal bilaterally, XI: sternocleidomastoid strength normal bilaterally, XII: tongue strength normal  Motor: Normal  Lab Results: No results found for this basename: WBC:2,HGB:2,HCT:2,PLT:2 in the last 72 hours BMET No results found for this basename: NA:2,K:2,CL:2,CO2:2,GLUCOSE:2,BUN:2,CREATININE:2,CALCIUM:2 in the last 72 hours  Studies/Results: No results found.  Assessment/Plan: ReAdy to transfer to floor. Doing well. Wound clean and dry. No signs infection. Tolerating regular diet. Will obtain pt consult  LOS: 2 days  Possible dc friday.    Shanara Schnieders L 05/15/2011, 2:06 PM

## 2011-05-16 NOTE — Plan of Care (Signed)
Problem: Consults Goal: Diagnosis - Craniotomy Subdural hematoma     

## 2011-05-16 NOTE — Plan of Care (Signed)
Problem: Consults Goal: Diagnosis - Craniotomy Outcome: Progressing Subdural hematoma     

## 2011-05-16 NOTE — Progress Notes (Signed)
  Subjective: Patient reports still having some incisional pain  Objective: Vital signs in last 24 hours: Temp:  [97.4 F (36.3 C)-98.1 F (36.7 C)] 98.1 F (36.7 C) (11/15 1042) Pulse Rate:  [62-148] 62  (11/15 1042) Resp:  [16-23] 20  (11/15 1042) BP: (101-116)/(42-66) 106/55 mmHg (11/15 1042) SpO2:  [95 %-98 %] 98 % (11/15 1042)  Intake/Output from previous day: 11/14 0701 - 11/15 0700 In: 1173 [P.O.:1170; I.V.:3] Out: -  Intake/Output this shift:    Physical Exam: Currently asleep.  Has been up and ambulating, taking po well.  Lab Results: No results found for this basename: WBC:2,HGB:2,HCT:2,PLT:2 in the last 72 hours BMET No results found for this basename: NA:2,K:2,CL:2,CO2:2,GLUCOSE:2,BUN:2,CREATININE:2,CALCIUM:2 in the last 72 hours  Studies/Results: No results found.  Assessment/Plan: Continue pain management.  Possible D/C in am if doing well.    LOS: 3 days    Dorian Heckle, MD 05/16/2011, 3:46 PM

## 2011-05-16 NOTE — Progress Notes (Signed)
Occupational Therapy Evaluation Patient Details Name: ROSARY FILOSA MRN: [id number] DOB: Jul 11, 1990 Today's Date: 05/16/2011 9:53-10:25  Problem List:  Patient Active Problem List  Diagnoses  . ANKLE SPRAIN, LEFT    Past Medical History:  Past Medical History  Diagnosis Date  . Headache    Past Surgical History:  Past Surgical History  Procedure Date  . Eye surgery     AS CHILD   . Ears     TUBES IN EARS     OT Assessment/Plan/Recommendation OT Assessment Clinical Impression Statement: This 20 year old female s/p surgery for chairi malformation presents to acute OT with RUE deficts for strength and sensation. No further acute OT needs acutely. Spoke with pt and we are in agreement to not start with OP OT right away but to see how she does for a few weeks (since she says she can tell that her RUE is already better than before the surgery) , and if still having issues with RUE then  she Zenaida Niece ask Dr. Franky Macho for an OPOT referral.  OT Recommendation/Assessment: Patient does not need any further OT services OT Goals    OT Evaluation Precautions/Restrictions  Precautions Precaution Comments: Cervical Required Braces or Orthoses: No Restrictions Weight Bearing Restrictions: No Prior Functioning Home Living Lives With: Alone Receives Help From: Family;Friend(s) (mother is coming into town for 3 weeks) Type of Home: Apartment Home Layout: One level Home Access: Level entry Bathroom Shower/Tub: Tub/shower unit;Curtain Firefighter: Standard Home Adaptive Equipment: None Additional Comments: Recommend to pt and friends in the room that she try to borrow a tub seat and 3-n-1 from her church--she verbalized understanding as to why. 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 Eating/Feeding: Simulated;Independent Where Assessed - Eating/Feeding: Bed level Grooming: Performed;Wash/dry hands;Independent;Other (comment) (with gel) Where Assessed - Grooming: Other (comment) (standing at gel dispenser) Upper Body Bathing: Not assessed Lower Body Bathing: Not assessed Upper Body Dressing: Not assessed Lower Body Dressing: Not assessed Toilet Transfer: Performed;Modified independent Toilet Transfer Method: Proofreader: Regular height toilet;Grab bars Toileting - Clothing Manipulation: Performed;Independent;Other (comment) (gown) Where Assessed - Toileting Clothing Manipulation: Standing Toileting - Hygiene: Performed;Independent Where Assessed - Toileting Hygiene: Sit on 3-in-1 or toilet Tub/Shower Transfer: Not assessed Tub/Shower Transfer Method: Not assessed Vision/Perception  Vision - History Baseline Vision:  (I've had trouble off/on with blurriness even before surgery) Visual History:  (ptosis of R eyelid\) Cognition Cognition Arousal/Alertness: Awake/alert Overall Cognitive Status: Appears within functional limits for tasks assessed Orientation Level: Oriented X4 Sensation/Coordination Sensation Light Touch: Impaired Detail Light Touch Impaired Details: Impaired RUE ("Starting to feel some, but not normal") Additional Comments: With pinch and nail bed pressure--"I feel it, but it does not hurt". LUE currently hypersensitive secondary to 2 IVs and pt having to keep her arm extended due to IV placements Coordination Fine Motor Movements are Fluid and Coordinated: Yes (manipulating pen,holding cup for meds,eating this am per pt) Extremity Assessment RUE Assessment RUE Assessment: Exceptions to [facility] RUE Strength RUE Overall Strength Comments: Grossly 3/5 LUE Assessment LUE Assessment: Not tested (secondary to IV sites and arm being hypersensitive) Mobility  Bed Mobility Bed Mobility: Yes Rolling Right: 6:  Modified independent (Device/Increase time) Right Sidelying to Sit: 6: Modified independent (Device/Increase time) Supine to Sit: 4: Min assist;HOB elevated (Comment degrees) (30 degrees, pt says its just easier) Sit to Supine - Right:  6: Modified independent (Device/Increase time) Transfers Transfers: Yes Sit to Stand: 6: Modified independent (Device/Increase time);With upper extremity assist;From bed Stand to Sit: 6: Modified independent (Device/Increase time);To bed;With upper extremity assist Exercises Other Exercises Other Exercises: Theraputty (red): composite flexion, DIP and PIP isolated flexion, thumb adduction, thumb and first finger pinch (10 repetition 3x/day), rolling into a ball, flattening into a pancake, rolling into a snake, pinch with thumb and first finger, fold over, pinch with thumb and first 2 fingers, start over. (3 repetitions 3x/day) Other Exercises: For sensation manipulating uncooked rice and/or beans for different textures, putting items (coins.marbles, etc) in it and trying to find the items without using vision. End of Session OT - End of Session Equipment Utilized During Treatment: Other (comment) (Red theraputty) Activity Tolerance: Patient limited by pain Patient left: in bed;with call bell in reach;with family/visitor present;Other (comment) (X3) General Behavior During Session: [facility] for tasks performed Cognition: [facility] for tasks performed   Evette Georges [phone] 05/16/2011, 11:02 AM

## 2011-05-16 NOTE — Progress Notes (Signed)
Physical Therapy Evaluation Patient Details Name: Taylor Garcia MRN: [id number] DOB: March 29, 1991 Today's Date: 05/16/2011  Problem List:  Patient Active Problem List  Diagnoses  . ANKLE SPRAIN, LEFT    Past Medical History:  Past Medical History  Diagnosis Date  . Headache    Past Surgical History:  Past Surgical History  Procedure Date  . Eye surgery     AS CHILD   . Ears     TUBES IN EARS     PT Assessment/Plan/Recommendation PT Assessment Clinical Impression Statement: Patient presents with acute pain of head status post suboccipital craniectomy and c1 laminectomy for Chiari. Patient has no skilled PT needs is safe to mobilize with family or nursing supervision PT Recommendation/Assessment: Patent does not need any further PT services No Skilled PT: Patient will have necessary level of assist by caregiver at discharge;Patient is supervision for all activity/mobility;All education completed (OT can supplement education on eval) Recommend OT to address right upper extremity deficits  PT Evaluation Precautions/Restrictions  Precautions Precaution Comments: Cervical Prior Functioning  Home Living Lives With: Alone Receives Help From: Family;Friend(s) (mother is coming into town for 3 weeks) Type of Home: Apartment Home Layout: One level Home Access: Level entry Home Adaptive Equipment: None Prior Function Level of Independence: Independent with homemaking with ambulation Driving: Yes Vocation: Full time 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 Cognition Cognition Arousal/Alertness: Awake/alert Overall Cognitive Status: Appears within functional limits for tasks assessed Orientation Level: Oriented X4 Sensation/Coordination Sensation Light Touch: Impaired by gross assessment (Right upper extremity) Extremity Assessment RLE Assessment RLE Assessment: Within Functional Limits LLE Assessment LLE Assessment: Within Functional Limits Mobility (including  Balance) Bed Mobility Bed Mobility: Yes Rolling Right: 6: Modified independent (Device/Increase time) Right Sidelying to Sit: 6: Modified independent (Device/Increase time) Sit to Supine - Right: 6: Modified independent (Device/Increase time) Transfers Transfers: Yes Sit to Stand: 6: Modified independent (Device/Increase time);From toilet;From bed Stand to Sit: To toilet;To bed;6: Modified independent (Device/Increase time) Ambulation/Gait Ambulation/Gait: Yes Ambulation/Gait Assistance: 5: Supervision Ambulation/Gait Assistance Details (indicate cue type and reason): Decreased speed secondary to pain. Intermittent support from handrail in hallway Ambulation Distance (Feet): 220 Feet Assistive device: None Gait Pattern: Within Functional Limits  Posture/Postural Control Posture/Postural Control: No significant limitations End of Session PT - End of Session Equipment Utilized During Treatment: Gait belt Activity Tolerance: Patient tolerated treatment well Patient left: in bed;with call bell in reach;with family/visitor present Nurse Communication: Mobility status for ambulation General Behavior During Session: [facility] for tasks performed Cognition: [facility] for tasks performed  Edwyna Perfect, PT  Pager [phone]  05/16/2011, 9:16 AM

## 2011-05-17 ENCOUNTER — Encounter (HOSPITAL_COMMUNITY): Payer: Self-pay | Admitting: Neurosurgery

## 2011-05-17 MED ORDER — OXYCODONE-ACETAMINOPHEN 5-325 MG PO TABS: 1.0000 | ORAL_TABLET | ORAL | Status: AC | PRN

## 2011-05-17 MED ORDER — DIAZEPAM 5 MG PO TABS: 5.0000 mg | ORAL_TABLET | Freq: Three times a day (TID) | ORAL | Status: AC | PRN

## 2011-05-17 NOTE — Progress Notes (Signed)
Subjective: Patient reports "When can I go home?"  Objective: Vital signs in last 24 hours: Temp:  [97.6 F (36.4 C)-98.3 F (36.8 C)] 98.3 F (36.8 C) (11/16 1005) Pulse Rate:  [62-75] 74  (11/16 1005) Resp:  [17-18] 17  (11/16 1005) BP: (93-122)/(52-71) 122/52 mmHg (11/16 1005) SpO2:  [95 %-100 %] 100 % (11/16 1005)  Intake/Output from previous day:   Intake/Output this shift:    Sleeping, awakens to voice. Incision without erythema, drainage, or swelling.  Some pain at site persists. No drift. PEARL. Pt reports only occasional dizziness when OOB.   Lab Results: No results found for this basename: WBC:2,HGB:2,HCT:2,PLT:2 in the last 72 hours BMET No results found for this basename: NA:2,K:2,CL:2,CO2:2,GLUCOSE:2,BUN:2,CREATININE:2,CALCIUM:2 in the last 72 hours  Studies/Results: No results found.  Assessment/Plan: Stable, awaiting d/c home.  LOS: 4 days  Pt will call office to schedule appt for suture removal in 1 week.    Gaylene, Moylan 05/17/2011, 2:07 PM

## 2011-06-11 NOTE — Discharge Summary (Signed)
  Admitting dx Chiari malformation Discharge dx chiari malformation Discharge dest Home Voiding, ambulating, tolerating regular diet Status alive and well Procedure Suboccipital decompression, c1 laminectomy, dural patch graft Admitted and taken to the or for uncomplicated procedure. She did very well post op. Wound is clean dry without signs of infection at discharge.

## 2011-06-14 NOTE — Progress Notes (Signed)
Utilization review completed. Jamese Trauger, RN, BSN. 06/14/11  

## 2011-07-12 ENCOUNTER — Other Ambulatory Visit (HOSPITAL_COMMUNITY): Payer: Self-pay | Admitting: Neurosurgery

## 2011-07-12 DIAGNOSIS — Q050 Cervical spina bifida with hydrocephalus: Secondary | ICD-10-CM

## 2011-07-16 ENCOUNTER — Emergency Department (HOSPITAL_COMMUNITY): Payer: Commercial Managed Care - PPO

## 2011-07-16 ENCOUNTER — Emergency Department (HOSPITAL_COMMUNITY)
Admission: EM | Admit: 2011-07-16 | Discharge: 2011-07-17 | Disposition: A | Discharge status: Home / Self Care | Payer: Commercial Managed Care - PPO | Attending: Emergency Medicine | Admitting: Emergency Medicine

## 2011-07-16 ENCOUNTER — Encounter (HOSPITAL_COMMUNITY): Payer: Self-pay | Admitting: *Deleted

## 2011-07-16 DIAGNOSIS — S161XXA Strain of muscle, fascia and tendon at neck level, initial encounter: Secondary | ICD-10-CM

## 2011-07-16 DIAGNOSIS — M542 Cervicalgia: Secondary | ICD-10-CM | POA: Insufficient documentation

## 2011-07-16 DIAGNOSIS — R51 Headache: Secondary | ICD-10-CM | POA: Insufficient documentation

## 2011-07-16 DIAGNOSIS — S139XXA Sprain of joints and ligaments of unspecified parts of neck, initial encounter: Secondary | ICD-10-CM | POA: Insufficient documentation

## 2011-07-16 DIAGNOSIS — R110 Nausea: Secondary | ICD-10-CM | POA: Insufficient documentation
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
## 2011-07-16 NOTE — ED Notes (Signed)
Pt report given and care endorsed to Suzy Bouchard., RN

## 2011-07-17 ENCOUNTER — Inpatient Hospital Stay (HOSPITAL_COMMUNITY): Admission: RE | Admit: 2011-07-17 | Payer: Commercial Managed Care - PPO | Source: Ambulatory Visit

## 2011-09-12 ENCOUNTER — Inpatient Hospital Stay (HOSPITAL_COMMUNITY)
Admission: AD | Admit: 2011-09-12 | Discharge: 2011-09-12 | Disposition: A | Discharge status: Home / Self Care | Payer: Self-pay | Attending: Obstetrics & Gynecology | Admitting: Obstetrics & Gynecology

## 2011-09-12 ENCOUNTER — Encounter (HOSPITAL_COMMUNITY): Payer: Self-pay | Admitting: *Deleted

## 2011-09-12 DIAGNOSIS — N750 Cyst of Bartholin's gland: Secondary | ICD-10-CM | POA: Insufficient documentation

## 2011-09-12 HISTORY — DX: Cyst of Bartholin's gland: N75.0

## 2011-09-12 MED ORDER — CLINDAMYCIN HCL 300 MG PO CAPS: 300.0000 mg | ORAL_CAPSULE | Freq: Three times a day (TID) | ORAL | Status: DC

## 2011-09-12 MED ORDER — OXYCODONE-ACETAMINOPHEN 5-325 MG PO TABS: 1.0000 | ORAL_TABLET | ORAL | Status: DC | PRN

## 2011-09-12 NOTE — MAU Note (Signed)
Pt G0, mirena IUD, reports bartholin cyst on right labia.

## 2011-09-12 NOTE — MAU Provider Note (Signed)
  History     CSN: [id number]  Arrival date and time: 09/12/11 0040   First Provider Initiated Contact with Patient 09/12/11 0106      Chief Complaint  Patient presents with  . Bartholin's Cyst   HPI 21 y.o. G0P0 with vulvar pain since yesterday morning, noticed possible bartholin cyst tonight. Has had 3 or 4 bartholin cysts in the past few years. States that this one is small, but noticeable.    Past Medical History  Diagnosis Date  . Headache   . Bartholin cyst     Past Surgical History  Procedure Date  . Eye surgery     AS CHILD   . Ears     TUBES IN EARS   . Suboccipital craniectomy cervical laminectomy 05/13/2011    Procedure: SUBOCCIPITAL CRANIECTOMY CERVICAL LAMINECTOMY/DURAPLASTY;  Surgeon: Carmela Hurt;  Location: [facility];  Service: Neurosurgery;  Laterality: N/A;  CHIARI DECOMPRESSION    History reviewed. No pertinent family history.  History  Substance Use Topics  . Smoking status: Current Everyday Smoker -- 0.2 packs/day  . Smokeless tobacco: Not on file  . Alcohol Use: No    Allergies:  Allergies  Allergen Reactions  . Coconut Oil Hives  . Hydrocodone-Acetaminophen Other (See Comments)    VICODIN Reaction:" vomits Blood"    No prescriptions prior to admission    Review of Systems  Constitutional: Negative.   Respiratory: Negative.   Cardiovascular: Negative.   Gastrointestinal: Negative for nausea, vomiting, abdominal pain, diarrhea and constipation.  Genitourinary: Negative for dysuria, urgency, frequency, hematuria and flank pain.       Negative for vaginal bleeding, vaginal discharge  Musculoskeletal: Negative.   Neurological: Negative.   Psychiatric/Behavioral: Negative.    Physical Exam   Blood pressure 121/79, pulse 87, temperature 98.4 F (36.9 C), temperature source Oral, resp. rate 16, height 5\' 4"  (1.626 m), weight 171 lb (77.565 kg).  Physical Exam  Nursing note and vitals reviewed. Constitutional: She is oriented to  person, place, and time. She appears well-developed and well-nourished. No distress.  Cardiovascular: Normal rate.   Respiratory: Effort normal.  Genitourinary:       Small tender right bartholin's cyst, deep  Musculoskeletal: Normal range of motion.  Neurological: She is alert and oriented to person, place, and time.  Skin: Skin is warm and dry.  Psychiatric: She has a normal mood and affect.    MAU Course  Procedures    Assessment and Plan  20 y.o. G0P0 with right bartholin's cyst  - I&D not performed tonight, cyst is small and not near surface Rx Clindamycin  F/U in GYN clinic or MAU if symptoms do not improve  Damien Batty 09/12/2011, 1:10 AM

## 2011-09-12 NOTE — Discharge Instructions (Signed)
Bartholin's Cyst and Abscess  Bartholin's glands produce mucus through small openings just outside the opening of the vagina. The mucus helps with lubrication around the vagina during sexual intercourse. If the duct becomes clogged, the gland will swell and cause a bulge on the inside of the vagina. If this becomes big enough, it can be seen and felt on the outside of the vagina as well. Sometimes, the swelling will shrink away by itself. However, if the cyst becomes infected, the Bartholin's cyst fills with pus and becomes more swollen, red and painful and becomes a Bartholin's abscess. This usually requires antibiotic treatment and surgical drainage. Sometimes, with minor surgery under local anesthesia, a small tube is placed in the cyst or abscess wall. This allows continued drainage for up to 6 weeks. Minor surgery can make a new opening to replace the clogged duct and help prevent future cysts or abscess.  If the abscess occurs several times, a minor operation with local anesthesia is necessary to remove the Bartholin's gland completely or to make it drain better. Cutting open the gland and suturing the edges to make the opening of the gland bigger (marsupialization) may be needed and should usually be done by your obstetrician-gyncology physician. Antibiotics are usually prescribed for this condition. Take all antibiotics as prescribed. Make sure to finish them even if you are doing better. Take warm sitz baths for 20 minutes, 3 times a day. See your caregiver for follow-up care as recommended.  SEEK MEDICAL CARE IF:    You have increasing pain, swelling, or redness near the vagina.   You have vomiting or inability to tolerate medicines.   You have a fever.   You have uncontrolled bleeding from the vagina.  Document Released: 07/25/2004 Document Revised: 06/06/2011 Document Reviewed: 07/28/2009  Exit[employer] Patient Information 2012 [employer].

## 2011-09-12 NOTE — MAU Note (Signed)
N. Frazier, CNM at bedside.  Assessment done and poc discussed with pt.  

## 2011-09-16 ENCOUNTER — Encounter (HOSPITAL_COMMUNITY): Payer: Self-pay | Admitting: *Deleted

## 2011-09-16 ENCOUNTER — Inpatient Hospital Stay (HOSPITAL_COMMUNITY)
Admission: AD | Admit: 2011-09-16 | Discharge: 2011-09-16 | Disposition: A | Discharge status: Home / Self Care | Payer: Commercial Managed Care - PPO | Source: Ambulatory Visit | Attending: Obstetrics & Gynecology | Admitting: Obstetrics & Gynecology

## 2011-09-16 DIAGNOSIS — N751 Abscess of Bartholin's gland: Secondary | ICD-10-CM | POA: Insufficient documentation

## 2011-09-16 MED ORDER — HYDROMORPHONE HCL 2 MG PO TABS
2.0000 mg | ORAL_TABLET | Freq: Once | ORAL | Status: AC
  Administered 2011-09-16: 2 mg via ORAL
  Filled 2011-09-16: qty 1

## 2011-09-16 MED ORDER — CEPHALEXIN 500 MG PO CAPS: 500.0000 mg | ORAL_CAPSULE | Freq: Four times a day (QID) | ORAL | Status: AC

## 2011-09-16 NOTE — MAU Note (Signed)
PT SAYS SHE HAS MIRENA IN FOR BIRTH CONTROL.    WAS HERE ON  WED OR Thursday- FOR BAR CYST- SAYS WAS SMALL- COULD NOT LANCE IT- GAVE HER PERCOCET AND ANTX.    HAD BRAIN SURGERY IN NOV [phone]

## 2011-09-16 NOTE — MAU Provider Note (Signed)
  History     CSN: [id number]  Arrival date and time: 09/16/11 2005   First Provider Initiated Contact with Patient 09/16/11 2055      No chief complaint on file.  HPI This is a 21 y.o. who presents with worsening of a left Bartholins abscess. She was seen 2 days ago for a small one and given Rx for pain med and antibiotic, neither of which she got filled. States clindamycin was too expensive. Now cyst is bigger.  Has had multiple episodes in past with word catheters which fell out upon getting home.  OB History    Grav Para Term Preterm Abortions TAB SAB Ect Mult Living   0               Past Medical History  Diagnosis Date  . Headache   . Bartholin cyst     Past Surgical History  Procedure Date  . Eye surgery     AS CHILD   . Ears     TUBES IN EARS   . Suboccipital craniectomy cervical laminectomy 05/13/2011    Procedure: SUBOCCIPITAL CRANIECTOMY CERVICAL LAMINECTOMY/DURAPLASTY;  Surgeon: Carmela Hurt;  Location: [facility];  Service: Neurosurgery;  Laterality: N/A;  CHIARI DECOMPRESSION    History reviewed. No pertinent family history.  History  Substance Use Topics  . Smoking status: Current Everyday Smoker -- 0.2 packs/day  . Smokeless tobacco: Not on file  . Alcohol Use: No    Allergies:  Allergies  Allergen Reactions  . Coconut Oil Hives  . Hydrocodone-Acetaminophen Other (See Comments)    VICODIN Reaction:" vomits Blood"    Prescriptions prior to admission  Medication Sig Dispense Refill  . acetaminophen (TYLENOL) 500 MG tablet Take 1,000 mg by mouth every 6 (six) hours as needed.        ROS As above  Physical Exam   Blood pressure 128/69, pulse 101, temperature 98.4 F (36.9 C), temperature source Oral, resp. rate 22, height 5\' 4"  (1.626 m), weight 173 lb 4 oz (78.586 kg).  Physical Exam  Constitutional: She is oriented to person, place, and time. She appears well-developed and well-nourished. No distress (But in pain).  HENT:  Head:  Normocephalic.  Cardiovascular: Normal rate.   Respiratory: Effort normal.  GI: Soft. She exhibits no distension and no mass. There is no tenderness. There is no rebound and no guarding.  Genitourinary: Uterus normal. No vaginal discharge found.       Right Bartholins, about 4cm, fluctuant. Upon palpation, expressed yellow purulence.  Was able to fully decompress cyst by expressing the fluid  Musculoskeletal: Normal range of motion.  Neurological: She is alert and oriented to person, place, and time.  Skin: Skin is warm and dry.  Psychiatric: She has a normal mood and affect.   MAU Course  Procedures  Assessment and Plan  A:  Right Bartholins Abscess, recurrent, with existing tract opened  P:  Will Rx keflex ($4 list)       Encouraged warm soaks and continued massage of cyst to keep fluid expressed       Discussed Marsupialization       Followup with GYN as desired  [facility] 09/16/2011, 9:04 PM

## 2011-09-16 NOTE — Discharge Instructions (Signed)
Bartholin's Cyst and Abscess  Bartholin's glands produce mucus through small openings just outside the opening of the vagina. The mucus helps with lubrication around the vagina during sexual intercourse. If the duct becomes clogged, the gland will swell and cause a bulge on the inside of the vagina. If this becomes big enough, it can be seen and felt on the outside of the vagina as well. Sometimes, the swelling will shrink away by itself. However, if the cyst becomes infected, the Bartholin's cyst fills with pus and becomes more swollen, red and painful and becomes a Bartholin's abscess. This usually requires antibiotic treatment and surgical drainage. Sometimes, with minor surgery under local anesthesia, a small tube is placed in the cyst or abscess wall. This allows continued drainage for up to 6 weeks. Minor surgery can make a new opening to replace the clogged duct and help prevent future cysts or abscess.  If the abscess occurs several times, a minor operation with local anesthesia is necessary to remove the Bartholin's gland completely or to make it drain better. Cutting open the gland and suturing the edges to make the opening of the gland bigger (marsupialization) may be needed and should usually be done by your obstetrician-gyncology physician. Antibiotics are usually prescribed for this condition. Take all antibiotics as prescribed. Make sure to finish them even if you are doing better. Take warm sitz baths for 20 minutes, 3 times a day. See your caregiver for follow-up care as recommended.  SEEK MEDICAL CARE IF:    You have increasing pain, swelling, or redness near the vagina.   You have vomiting or inability to tolerate medicines.   You have a fever.   You have uncontrolled bleeding from the vagina.  Document Released: 07/25/2004 Document Revised: 06/06/2011 Document Reviewed: 07/28/2009  Exit[employer] Patient Information 2012 [employer].

## 2011-09-16 NOTE — MAU Note (Signed)
Wynelle Bourgeois CNM at bedside to examine cyst.

## 2011-11-07 ENCOUNTER — Ambulatory Visit (HOSPITAL_COMMUNITY)
Admission: RE | Admit: 2011-11-07 | Discharge: 2011-11-07 | Disposition: A | Discharge status: Home / Self Care | Payer: Self-pay | Source: Ambulatory Visit | Attending: Neurosurgery | Admitting: Neurosurgery

## 2011-11-07 DIAGNOSIS — M546 Pain in thoracic spine: Secondary | ICD-10-CM | POA: Insufficient documentation

## 2011-11-07 DIAGNOSIS — Q050 Cervical spina bifida with hydrocephalus: Secondary | ICD-10-CM

## 2012-02-13 ENCOUNTER — Encounter (HOSPITAL_COMMUNITY): Payer: Self-pay | Admitting: *Deleted

## 2012-02-13 ENCOUNTER — Inpatient Hospital Stay (HOSPITAL_COMMUNITY)
Admission: AD | Admit: 2012-02-13 | Discharge: 2012-02-13 | Disposition: A | Discharge status: Home / Self Care | Payer: Self-pay | Source: Ambulatory Visit | Attending: Obstetrics & Gynecology | Admitting: Obstetrics & Gynecology

## 2012-02-13 DIAGNOSIS — Z3049 Encounter for surveillance of other contraceptives: Secondary | ICD-10-CM | POA: Insufficient documentation

## 2012-02-13 DIAGNOSIS — Z3202 Encounter for pregnancy test, result negative: Secondary | ICD-10-CM

## 2012-02-13 LAB — URINALYSIS, ROUTINE W REFLEX MICROSCOPIC
Bilirubin Urine: NEGATIVE
Nitrite: NEGATIVE
Protein, ur: NEGATIVE mg/dL
Specific Gravity, Urine: <1.005 — ABNORMAL LOW (ref 1.005–1.030)
Urobilinogen, UA: 0.2 mg/dL (ref 0.0–1.0)

## 2012-02-13 LAB — URINE MICROSCOPIC-ADD ON

## 2012-02-13 NOTE — MAU Note (Signed)
Pt also states she has gained weight.

## 2012-02-13 NOTE — MAU Note (Signed)
Pt states has had mirena x4 years, no menstrual cycles with mirena, took home upt last pm that was negative. Feels "different", feels fluttering in her stomach, feels abdomen tightening. Denies abnormal vaginal discharge. States last Saturday awoke with a puddle under her.

## 2012-02-13 NOTE — MAU Provider Note (Signed)
History     CSN: [id number]  Arrival date and time: 02/13/12 1447   First Provider Initiated Contact with Patient 02/13/12 1619      Chief Complaint  Patient presents with  . Possible pregnancy, Mirena in place    HPI Patient is a 21 yo female who presents for pregnancy test with mirena in place.  States she had a negative pregnancy test at home which she took after feeling a fluttering in her lower abdomen.  She denies having any periods while on the mirena. Has had mirena for 4 years.  Endorses nausea one time last night.  Denies vomitng, bowel changes, and urination changes. Also states that she wants the placement of her mirena checked as the female in the room with her can feel the strings during intercourse.  She does not regularly check the placement of the mirena strings.  OB History    Grav Para Term Preterm Abortions TAB SAB Ect Mult Living   0               Past Medical History  Diagnosis Date  . Headache   . Bartholin cyst     Past Surgical History  Procedure Date  . Eye surgery     AS CHILD   . Ears     TUBES IN EARS   . Suboccipital craniectomy cervical laminectomy 05/13/2011    Procedure: SUBOCCIPITAL CRANIECTOMY CERVICAL LAMINECTOMY/DURAPLASTY;  Surgeon: Carmela Hurt;  Location: [facility];  Service: Neurosurgery;  Laterality: N/A;  CHIARI DECOMPRESSION    History reviewed. No pertinent family history.  History  Substance Use Topics  . Smoking status: Current Everyday Smoker -- 0.2 packs/day  . Smokeless tobacco: Not on file  . Alcohol Use: No    Allergies:  Allergies  Allergen Reactions  . Coconut Oil Hives  . Hydrocodone-Acetaminophen Other (See Comments)    VICODIN Reaction:" vomits Blood"    Prescriptions prior to admission  Medication Sig Dispense Refill  . POTASSIUM PO Take 1 tablet by mouth daily. Over the counter potassium supplement (from [facility])        ROS negative except per HPI Physical Exam   Blood pressure 126/68, pulse 101,  temperature 98 F (36.7 C), temperature source Oral, resp. rate 16, height 5\' 4"  (1.626 m), weight 83.008 kg (183 lb).  Physical Exam  Constitutional: She is oriented to person, place, and time. She appears well-developed and well-nourished.  HENT:  Head: Normocephalic and atraumatic.  Respiratory: Effort normal.  Genitourinary:       Speculum exam: mirena strings in place, IUD not visualized in cervical os, no discharge or bleeding Cervical check: mirena strings felt  Neurological: She is alert and oriented to person, place, and time.  Psychiatric: She has a normal mood and affect.   Results for orders placed during the hospital encounter of 02/13/12 (from the past 24 hour(s))  URINALYSIS, ROUTINE W REFLEX MICROSCOPIC     Status: Abnormal   Collection Time   02/13/12  3:19 PM      Component Value Range   Color, Urine STRAW (*) YELLOW   APPearance CLEAR  CLEAR   Specific Gravity, Urine <1.005 (*) 1.005 - 1.030   pH 7.0  5.0 - 8.0   Glucose, UA NEGATIVE  NEGATIVE mg/dL   Hgb urine dipstick SMALL (*) NEGATIVE   Bilirubin Urine NEGATIVE  NEGATIVE   Ketones, ur NEGATIVE  NEGATIVE mg/dL   Protein, ur NEGATIVE  NEGATIVE mg/dL   Urobilinogen, UA 0.2  0.0 - 1.0 mg/dL   Nitrite NEGATIVE  NEGATIVE   Leukocytes, UA SMALL (*) NEGATIVE  URINE MICROSCOPIC-ADD ON     Status: Abnormal   Collection Time   02/13/12  3:19 PM      Component Value Range   Squamous Epithelial / LPF FEW (*) RARE   WBC, UA 0-2  <3 WBC/hpf   Negative pregnancy test. MAU Course  Procedures  Assessment and Plan  Patient is a 21 yo female here for pregnancy test and confirmation of mirena placement.  Patient not pregnant. Mirena in place.  Advised patient that she should establish care in [place] so that she could have mirena replaced at the end of 5 years.  Discharged home from the MAU.  Marikay Alar 02/13/2012, 4:27 PM   I saw and examined patient along with student and agree with above note.

## 2012-02-16 NOTE — MAU Provider Note (Signed)
See by Georges Mouse.  Medical Screening exam and patient care preformed by advanced practice provider.  Agree with the above management.

## 2012-02-17 ENCOUNTER — Inpatient Hospital Stay (HOSPITAL_COMMUNITY)
Admission: AD | Admit: 2012-02-17 | Discharge: 2012-02-18 | Disposition: A | Discharge status: Home / Self Care | Payer: Self-pay | Source: Ambulatory Visit | Attending: Obstetrics & Gynecology | Admitting: Obstetrics & Gynecology

## 2012-02-17 DIAGNOSIS — Z975 Presence of (intrauterine) contraceptive device: Secondary | ICD-10-CM

## 2012-02-17 DIAGNOSIS — N730 Acute parametritis and pelvic cellulitis: Secondary | ICD-10-CM | POA: Diagnosis present

## 2012-02-17 DIAGNOSIS — N739 Female pelvic inflammatory disease, unspecified: Secondary | ICD-10-CM | POA: Insufficient documentation

## 2012-02-17 DIAGNOSIS — R1032 Left lower quadrant pain: Secondary | ICD-10-CM | POA: Insufficient documentation

## 2012-02-17 DIAGNOSIS — Z30431 Encounter for routine checking of intrauterine contraceptive device: Secondary | ICD-10-CM | POA: Insufficient documentation

## 2012-02-17 LAB — URINE MICROSCOPIC-ADD ON

## 2012-02-17 LAB — URINALYSIS, ROUTINE W REFLEX MICROSCOPIC
Glucose, UA: NEGATIVE mg/dL
Hgb urine dipstick: NEGATIVE
Specific Gravity, Urine: 1.015 (ref 1.005–1.030)
pH: 6 (ref 5.0–8.0)

## 2012-02-17 NOTE — MAU Note (Signed)
Pt reports she was seen here 4 days ago and had a negative pregnancy test. Pt reports "I think I have a tubular pregnancy", has an IUD so she does not have a period. Left lower quadrant pain x 4 days

## 2012-02-18 ENCOUNTER — Inpatient Hospital Stay (HOSPITAL_COMMUNITY): Payer: Self-pay

## 2012-02-18 ENCOUNTER — Encounter: Payer: Self-pay | Admitting: Obstetrics & Gynecology

## 2012-02-18 ENCOUNTER — Encounter (HOSPITAL_COMMUNITY): Payer: Self-pay | Admitting: *Deleted

## 2012-02-18 DIAGNOSIS — N730 Acute parametritis and pelvic cellulitis: Secondary | ICD-10-CM | POA: Diagnosis present

## 2012-02-18 DIAGNOSIS — R1032 Left lower quadrant pain: Secondary | ICD-10-CM

## 2012-02-18 DIAGNOSIS — Z975 Presence of (intrauterine) contraceptive device: Secondary | ICD-10-CM

## 2012-02-18 LAB — CBC
MCH: 30.4 pg (ref 26.0–34.0)
MCHC: 33.9 g/dL (ref 30.0–36.0)
MCV: 89.6 fL (ref 78.0–100.0)
Platelets: 250 10*3/uL (ref 150–400)
RBC: 4.44 MIL/uL (ref 3.87–5.11)

## 2012-02-18 LAB — POCT PREGNANCY, URINE: Preg Test, Ur: NEGATIVE

## 2012-02-18 LAB — WET PREP, GENITAL

## 2012-02-18 MED ORDER — DOXYCYCLINE HYCLATE 100 MG PO TABS: 100.0000 mg | ORAL_TABLET | Freq: Two times a day (BID) | ORAL | Status: AC

## 2012-02-18 MED ORDER — CEFTRIAXONE SODIUM 250 MG IJ SOLR
250.0000 mg | INTRAMUSCULAR | Status: AC
  Administered 2012-02-18: 250 mg via INTRAMUSCULAR
  Filled 2012-02-18: qty 250

## 2012-02-18 MED ORDER — IBUPROFEN 600 MG PO TABS: 600.0000 mg | ORAL_TABLET | Freq: Four times a day (QID) | ORAL | Status: AC | PRN

## 2012-02-18 NOTE — MAU Provider Note (Signed)
Anaiyah L Poteat21 y.o.G0P0 Chief Complaint  Patient presents with  . Abdominal Pain     None     SUBJECTIVE  HPI: Pt presents to MAU with sharp constant LLQ pain x2 days.  She has a Mirena in place x4 years and is amenorrheic with this.  She is very worried about an ectopic pregnancy and wants a blood test for pregnancy.  She has had 3 negative home pregnancy tests.  She denies vaginal bleeding, vaginal itching/burning, urinary symptoms, h/a, dizziness, n/v, or fever/chills.    Past Medical History  Diagnosis Date  . Headache   . Bartholin cyst    Past Surgical History  Procedure Date  . Eye surgery     AS CHILD   . Ears     TUBES IN EARS   . Suboccipital craniectomy cervical laminectomy 05/13/2011    Procedure: SUBOCCIPITAL CRANIECTOMY CERVICAL LAMINECTOMY/DURAPLASTY;  Surgeon: Carmela Hurt;  Location: [facility];  Service: Neurosurgery;  Laterality: N/A;  CHIARI DECOMPRESSION   History   Social History  . Marital Status: Single    Spouse Name: N/A    Number of Children: N/A  . Years of Education: N/A   Occupational History  . Not on file.   Social History Main Topics  . Smoking status: Current Everyday Smoker -- 0.2 packs/day  . Smokeless tobacco: Not on file  . Alcohol Use: No  . Drug Use: No  . Sexually Active: Yes    Birth Control/ Protection: Implant   Other Topics Concern  . Not on file   Social History Narrative  . No narrative on file   No current facility-administered medications on file prior to encounter.   Current Outpatient Prescriptions on File Prior to Encounter  Medication Sig Dispense Refill  . POTASSIUM PO Take 1 tablet by mouth daily. Over the counter potassium supplement (from [facility])       Allergies  Allergen Reactions  . Coconut Oil Hives  . Hydrocodone-Acetaminophen Other (See Comments)    VICODIN Reaction:" vomits Blood"    ROS: Pertinent items in HPI  OBJECTIVE Blood pressure 125/72, pulse 92, temperature 98 F (36.7 C),  temperature source Oral, resp. rate 18, height 5\' 4"  (1.626 m), weight 84.823 kg (187 lb), SpO2 100.00%.  GENERAL: Well-developed, well-nourished female in no acute distress.  HEENT: Normocephalic, good dentition HEART: normal rate RESP: normal effort ABDOMEN: Soft, nontender EXTREMITIES: Nontender, no edema NEURO: Alert and oriented Pelvic exam: Cervix pink, visually closed, without lesion, Mirena string visible, scant clear discharge, vaginal walls and external genitalia normal Bimanual exam: Cervix 0/long/high, firm, anterior, mild CMT, uterus nontender, nonenlarged, adnexa without tenderness, enlargement, or mass   LAB RESULTS  Results for orders placed during the hospital encounter of 02/17/12 (from the past 24 hour(s))  URINALYSIS, ROUTINE W REFLEX MICROSCOPIC     Status: Abnormal   Collection Time   02/17/12 11:40 PM      Component Value Range   Color, Urine YELLOW  YELLOW   APPearance CLEAR  CLEAR   Specific Gravity, Urine 1.015  1.005 - 1.030   pH 6.0  5.0 - 8.0   Glucose, UA NEGATIVE  NEGATIVE mg/dL   Hgb urine dipstick NEGATIVE  NEGATIVE   Bilirubin Urine NEGATIVE  NEGATIVE   Ketones, ur NEGATIVE  NEGATIVE mg/dL   Protein, ur NEGATIVE  NEGATIVE mg/dL   Urobilinogen, UA 0.2  0.0 - 1.0 mg/dL   Nitrite NEGATIVE  NEGATIVE   Leukocytes, UA TRACE (*) NEGATIVE  URINE  MICROSCOPIC-ADD ON     Status: Abnormal   Collection Time   02/17/12 11:40 PM      Component Value Range   Squamous Epithelial / LPF FEW (*) RARE   WBC, UA 3-6  <3 WBC/hpf   Bacteria, UA FEW (*) RARE   Urine-Other MUCOUS PRESENT    POCT PREGNANCY, URINE     Status: Normal   Collection Time   02/17/12 11:52 PM      Component Value Range   Preg Test, Ur NEGATIVE  NEGATIVE    IMAGING US Transvaginal Non-ob  02/18/2012  *RADIOLOGY REPORT*  Clinical Data: The left lower quadrant abdominal pain.  IUD. Negative pregnancy test.  TRANSABDOMINAL AND TRANSVAGINAL ULTRASOUND OF PELVIS Technique:  Both transabdominal  and transvaginal ultrasound examinations of the pelvis were performed. Transabdominal technique was performed for global imaging of the pelvis including uterus, ovaries, adnexal regions, and pelvic cul-de-sac.  It was necessary to proceed with endovaginal exam following the transabdominal exam to visualize the endometrium and ovaries.  Comparison:  03/15/2010  Findings:  Uterus: The uterus is anteverted and measures 6.5 x 2.8 x 3.6 cm. Normal homogeneous myometrial echotexture.  No focal masses.  Endometrium: Normal endometrial stripe thickness measured at 4 mm. Intrauterine device in place.  Right ovary:  Right ovary is best seen on transabdominal imaging and measures about 2.9 x 1.7 x 1.7 cm.  Normal follicular changes are suggested.  Left ovary: Left ovary measures 2.9 x 2 x 2 cm.  Normal follicular changes.  Other findings: No free pelvic fluid collections.  IMPRESSION: Intrauterine device is in place.  Otherwise normal appearance of the uterus and ovaries.  No significant change since previous study.   Original Report Authenticated By: Marlon Pel, M.D.    US Pelvis Complete  02/18/2012  *RADIOLOGY REPORT*  Clinical Data: The left lower quadrant abdominal pain.  IUD. Negative pregnancy test.  TRANSABDOMINAL AND TRANSVAGINAL ULTRASOUND OF PELVIS Technique:  Both transabdominal and transvaginal ultrasound examinations of the pelvis were performed. Transabdominal technique was performed for global imaging of the pelvis including uterus, ovaries, adnexal regions, and pelvic cul-de-sac.  It was necessary to proceed with endovaginal exam following the transabdominal exam to visualize the endometrium and ovaries.  Comparison:  03/15/2010  Findings:  Uterus: The uterus is anteverted and measures 6.5 x 2.8 x 3.6 cm. Normal homogeneous myometrial echotexture.  No focal masses.  Endometrium: Normal endometrial stripe thickness measured at 4 mm. Intrauterine device in place.  Right ovary:  Right ovary is best  seen on transabdominal imaging and measures about 2.9 x 1.7 x 1.7 cm.  Normal follicular changes are suggested.  Left ovary: Left ovary measures 2.9 x 2 x 2 cm.  Normal follicular changes.  Other findings: No free pelvic fluid collections.  IMPRESSION: Intrauterine device is in place.  Otherwise normal appearance of the uterus and ovaries.  No significant change since previous study.   Original Report Authenticated By: Marlon Pel, M.D.     ASSESSMENT PID IUD in place   PLAN Rocephin 250 mg IM Doxycycline 100 mg BID x14 days F/U in Gyn clinic Return to MAU as needed   LEFTWICH-KIRBY, Casimer Russett 02/18/2012 1:24 AM

## 2012-02-18 NOTE — MAU Provider Note (Signed)
Medical Screening exam and patient care preformed by advanced practice provider.  Agree with the above management.  

## 2012-03-18 ENCOUNTER — Encounter: Payer: Self-pay | Admitting: Obstetrics & Gynecology

## 2012-04-29 ENCOUNTER — Other Ambulatory Visit (HOSPITAL_COMMUNITY): Payer: Self-pay | Admitting: Neurosurgery

## 2012-04-29 DIAGNOSIS — M546 Pain in thoracic spine: Secondary | ICD-10-CM

## 2012-04-29 DIAGNOSIS — M549 Dorsalgia, unspecified: Secondary | ICD-10-CM

## 2012-04-30 ENCOUNTER — Ambulatory Visit (HOSPITAL_COMMUNITY)
Admission: RE | Admit: 2012-04-30 | Discharge: 2012-04-30 | Disposition: A | Discharge status: Home / Self Care | Payer: Self-pay | Source: Ambulatory Visit | Attending: Neurosurgery | Admitting: Neurosurgery

## 2012-04-30 DIAGNOSIS — M549 Dorsalgia, unspecified: Secondary | ICD-10-CM

## 2012-04-30 DIAGNOSIS — M546 Pain in thoracic spine: Secondary | ICD-10-CM | POA: Insufficient documentation

## 2012-05-03 ENCOUNTER — Emergency Department (HOSPITAL_COMMUNITY)
Admission: EM | Admit: 2012-05-03 | Discharge: 2012-05-03 | Disposition: A | Discharge status: Home / Self Care | Payer: Self-pay | Attending: Emergency Medicine | Admitting: Emergency Medicine

## 2012-05-03 ENCOUNTER — Encounter (HOSPITAL_COMMUNITY): Payer: Self-pay

## 2012-05-03 DIAGNOSIS — Z8742 Personal history of other diseases of the female genital tract: Secondary | ICD-10-CM | POA: Insufficient documentation

## 2012-05-03 DIAGNOSIS — M436 Torticollis: Secondary | ICD-10-CM | POA: Insufficient documentation

## 2012-05-03 DIAGNOSIS — N390 Urinary tract infection, site not specified: Secondary | ICD-10-CM | POA: Insufficient documentation

## 2012-05-03 DIAGNOSIS — M549 Dorsalgia, unspecified: Secondary | ICD-10-CM | POA: Insufficient documentation

## 2012-05-03 DIAGNOSIS — F17200 Nicotine dependence, unspecified, uncomplicated: Secondary | ICD-10-CM | POA: Insufficient documentation

## 2012-05-03 DIAGNOSIS — R42 Dizziness and giddiness: Secondary | ICD-10-CM | POA: Insufficient documentation

## 2012-05-03 DIAGNOSIS — R51 Headache: Secondary | ICD-10-CM | POA: Insufficient documentation

## 2012-05-03 DIAGNOSIS — M542 Cervicalgia: Secondary | ICD-10-CM | POA: Insufficient documentation

## 2012-05-03 HISTORY — DX: Dorsalgia, unspecified: M54.9

## 2012-05-03 MED ORDER — OXYCODONE-ACETAMINOPHEN 5-325 MG PO TABS
2.0000 | ORAL_TABLET | Freq: Once | ORAL | Status: AC
  Administered 2012-05-03: 2 via ORAL
  Filled 2012-05-03: qty 2

## 2012-05-03 MED ORDER — OXYCODONE-ACETAMINOPHEN 5-325 MG PO TABS: 1.0000 | ORAL_TABLET | ORAL | Status: DC | PRN

## 2012-05-03 NOTE — ED Notes (Signed)
Pt presents with NAD- chronic back pain x 2 years.  MRI last Thursday.  Follow up tomorrow with Neurologist.  Pt reports increased pain this evening.  No neuro deficits GCS 15 PEERL- pt c/o of urinary frequency at present

## 2012-05-03 NOTE — ED Notes (Signed)
Pt refusing urine test reports- not a UTI-  Pain is at thorasic area only

## 2012-05-06 NOTE — ED Provider Notes (Signed)
Medical screening examination/treatment/procedure(s) were performed by non-physician practitioner and as supervising physician I was immediately available for consultation/collaboration.  Flint Melter, MD 05/06/12 [phone]

## 2012-05-06 NOTE — ED Provider Notes (Signed)
History     CSN: [id number]  Arrival date & time 05/03/12  4098   First MD Initiated Contact with Patient 05/03/12 2053      Chief Complaint  Patient presents with  . Back Pain  . Urinary Tract Infection    (Consider location/radiation/quality/duration/timing/severity/associated sxs/prior treatment) HPI Comments: Taylor Garcia 21 y.o. 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  Rates it at 10 out of 10. Chronic right hand paresthesias and diminished grip strength is unchanged. 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  Denies weakness, loss of bowel/bladder function or saddle anesthesia. Denies recent injections or procedures to the spine. Denies fevers, chills, myalgias, arthralgias, nausea, vomiting, diarrhea. Denies ha Photophobia or rash.      Patient is a 21 y.o. female presenting with back pain and urinary tract infection. The history is provided by the patient and medical records. No language interpreter was used.  Back Pain  Pertinent negatives include no numbness and no weakness.  Urinary Tract Infection Associated symptoms include neck pain. Pertinent negatives include no numbness or weakness.    Past Medical History  Diagnosis Date  . Headache   . Bartholin cyst   . Back pain     Past Surgical History  Procedure Date  . Eye surgery     AS CHILD   . Ears     TUBES IN EARS   . Suboccipital craniectomy cervical laminectomy 05/13/2011    Procedure: SUBOCCIPITAL CRANIECTOMY CERVICAL LAMINECTOMY/DURAPLASTY;  Surgeon: Carmela Hurt;  Location: [facility];  Service: Neurosurgery;  Laterality: N/A;  CHIARI DECOMPRESSION    No family history on file.  History  Substance Use Topics  . Smoking status: Current Every Day Smoker --  0.2 packs/day  . Smokeless tobacco: Not on file  . Alcohol Use: No    OB History    Grav Para Term Preterm Abortions TAB SAB Ect Mult Living   0               Review of Systems  Constitutional: Negative.   HENT: Positive for neck pain and neck stiffness.   Eyes: Negative.  Negative for visual disturbance.  Respiratory: Negative.   Cardiovascular: Negative.   Gastrointestinal: Negative.   Genitourinary: Negative.   Musculoskeletal: Positive for back pain.  Skin: Negative.   Neurological: Positive for dizziness. Negative for speech difficulty, weakness, light-headedness and numbness.  All other systems reviewed and are negative.    Allergies  Coconut oil and Hydrocodone-acetaminophen  Home Medications   Current Outpatient Rx  Name  Route  Sig  Dispense  Refill  . ACETAMINOPHEN 500 MG PO TABS   Oral   Take 1,000 mg by mouth every 6 (six) hours as needed. pain         . OXYCODONE-ACETAMINOPHEN 5-325 MG PO TABS   Oral   Take 1-2 tablets by mouth every 4 (four) hours as needed for pain.   10 tablet   0     BP 105/59  Pulse 77  Temp 98.3 F (36.8 C) (Oral)  Resp 18  Ht 5\' 4"  (1.626 m)  Wt 160 lb (72.576 kg)  BMI 27.46 kg/m2  SpO2 97%  Physical Exam  Vitals reviewed. Constitutional: She is oriented to person, place, and time.  She appears well-developed.       Patient is sitting in wheelchair. She is clearly agitated.   HENT:  Head: Normocephalic and atraumatic.  Eyes: Conjunctivae normal and EOM are normal. Pupils are equal, round, and reactive to light. No scleral icterus.  Neck: Normal range of motion.  Cardiovascular: Normal rate, regular rhythm and normal heart sounds.  Exam reveals no gallop and no friction rub.   No murmur heard. Pulmonary/Chest: Effort normal and breath sounds normal. No respiratory distress.  Abdominal: Soft. Bowel sounds are normal. She exhibits no distension and no mass. There is no tenderness. There is no guarding.    Neurological: She is alert and oriented to person, place, and time.       Subjective paresthesias left arm.  Skin: Skin is warm and dry.    ED Course  Procedures (including critical care time)  Labs Reviewed - No data to display No results found.   1. Back pain       MDM  MRI results negative. Patient is highly agitated and is here requesting pain control.  She has a f/u appointment with D.r Franky Macho tomorrow.  No red flag sxs.  I will dc with pain control. Discussed reasons to seek immediate care. Patient expresses understanding and agrees with plan.         Arthor Captain, PA-C 05/06/12 0124

## 2012-08-05 ENCOUNTER — Emergency Department (INDEPENDENT_AMBULATORY_CARE_PROVIDER_SITE_OTHER)
Admission: EM | Admit: 2012-08-05 | Discharge: 2012-08-05 | Disposition: A | Discharge status: Home / Self Care | Payer: Self-pay | Source: Home / Self Care | Attending: Family Medicine | Admitting: Family Medicine

## 2012-08-05 ENCOUNTER — Encounter (HOSPITAL_COMMUNITY): Payer: Self-pay | Admitting: *Deleted

## 2012-08-05 ENCOUNTER — Emergency Department (INDEPENDENT_AMBULATORY_CARE_PROVIDER_SITE_OTHER): Payer: Self-pay

## 2012-08-05 DIAGNOSIS — S62309A Unspecified fracture of unspecified metacarpal bone, initial encounter for closed fracture: Secondary | ICD-10-CM

## 2012-08-05 NOTE — ED Provider Notes (Signed)
History     CSN: [id number]  Arrival date & time 08/05/12  1230   First MD Initiated Contact with Patient 08/05/12 1409      Chief Complaint  Patient presents with  . Hand Pain    (Consider location/radiation/quality/duration/timing/severity/associated sxs/prior treatment) Patient is a 22 y.o. female presenting with hand injury. The history is provided by the patient.  Hand Injury  The incident occurred 12 to 24 hours ago. The injury mechanism was a direct blow. The pain is present in the right hand. The pain is moderate. She reports no foreign bodies present. The symptoms are aggravated by palpation.    Past Medical History  Diagnosis Date  . Headache   . Bartholin cyst   . Back pain     Past Surgical History  Procedure Date  . Eye surgery     AS CHILD   . Ears     TUBES IN EARS   . Suboccipital craniectomy cervical laminectomy 05/13/2011    Procedure: SUBOCCIPITAL CRANIECTOMY CERVICAL LAMINECTOMY/DURAPLASTY;  Surgeon: Carmela Hurt;  Location: [facility];  Service: Neurosurgery;  Laterality: N/A;  CHIARI DECOMPRESSION    History reviewed. No pertinent family history.  History  Substance Use Topics  . Smoking status: Current Every Day Smoker -- 0.2 packs/day  . Smokeless tobacco: Not on file  . Alcohol Use: No    OB History    Grav Para Term Preterm Abortions TAB SAB Ect Mult Living   0               Review of Systems  Constitutional: Negative.   Musculoskeletal: Positive for joint swelling.  Skin: Positive for wound.    Allergies  Coconut oil and Hydrocodone-acetaminophen  Home Medications   Current Outpatient Rx  Name  Route  Sig  Dispense  Refill  . ACETAMINOPHEN 500 MG PO TABS   Oral   Take 1,000 mg by mouth every 6 (six) hours as needed. pain         . OXYCODONE-ACETAMINOPHEN 5-325 MG PO TABS   Oral   Take 1-2 tablets by mouth every 4 (four) hours as needed for pain.   10 tablet   0     BP 104/62  Pulse 80  Temp 98.2 F (36.8  C) (Oral)  Resp 18  SpO2 100%  Physical Exam  Nursing note and vitals reviewed. Constitutional: She is oriented to person, place, and time. She appears well-developed and well-nourished. No distress.  Musculoskeletal: She exhibits tenderness.       Hands: Neurological: She is alert and oriented to person, place, and time.  Skin: Skin is warm and dry.    ED Course  Procedures (including critical care time)  Labs Reviewed - No data to display Dg Hand Complete Right  08/05/2012  *RADIOLOGY REPORT*  Clinical Data: Punched wall.  Hand injury and swelling.  RIGHT HAND - COMPLETE 3+ VIEW  Comparison: None.  Findings: Three-view study shows a comminuted fracture of the distal fifth metacarpal with posterior angulation.  No other acute fracture is evident.  No subluxation or dislocation.  IMPRESSION: Comminuted fracture of the distal fifth metacarpal.   Original Report Authenticated By: Kennith Center, M.D.      1. Closed fracture of metacarpal of right hand       MDM  X-rays reviewed and report per radiologist.        Linna Hoff, MD 08/05/12 [phone]

## 2012-08-05 NOTE — Progress Notes (Signed)
Orthopedic Tech Progress Note Patient Details:  Taylor Garcia April 03, 1991 [id number]  Ortho Devices Type of Ortho Device: Ulna gutter splint Ortho Device/Splint Location: RIGHT ULNA GUTTER SPLINT Ortho Device/Splint Interventions: Application   Cammer, Mickie Bail 08/05/2012, 4:40 PM

## 2012-08-05 NOTE — ED Notes (Signed)
Pt reports     She  struck  A  Wall  Last  Pm    She  Has  Pain  And  Swelling  To  Her  r  Hand

## 2012-08-06 ENCOUNTER — Encounter (HOSPITAL_BASED_OUTPATIENT_CLINIC_OR_DEPARTMENT_OTHER): Payer: Self-pay | Admitting: *Deleted

## 2012-08-06 DIAGNOSIS — IMO0002 Reserved for concepts with insufficient information to code with codable children: Secondary | ICD-10-CM | POA: Insufficient documentation

## 2012-08-06 DIAGNOSIS — S62639A Displaced fracture of distal phalanx of unspecified finger, initial encounter for closed fracture: Secondary | ICD-10-CM | POA: Insufficient documentation

## 2012-08-06 DIAGNOSIS — F17200 Nicotine dependence, unspecified, uncomplicated: Secondary | ICD-10-CM | POA: Insufficient documentation

## 2012-08-06 DIAGNOSIS — Z872 Personal history of diseases of the skin and subcutaneous tissue: Secondary | ICD-10-CM | POA: Insufficient documentation

## 2012-08-06 DIAGNOSIS — Y929 Unspecified place or not applicable: Secondary | ICD-10-CM | POA: Insufficient documentation

## 2012-08-06 DIAGNOSIS — Y9389 Activity, other specified: Secondary | ICD-10-CM | POA: Insufficient documentation

## 2012-08-06 DIAGNOSIS — R209 Unspecified disturbances of skin sensation: Secondary | ICD-10-CM | POA: Insufficient documentation

## 2012-08-06 NOTE — ED Notes (Signed)
Pt c/ decreased sensation to right arm after splint was applied yesterday.

## 2012-08-07 ENCOUNTER — Emergency Department (HOSPITAL_BASED_OUTPATIENT_CLINIC_OR_DEPARTMENT_OTHER): Payer: Self-pay

## 2012-08-07 ENCOUNTER — Emergency Department (HOSPITAL_BASED_OUTPATIENT_CLINIC_OR_DEPARTMENT_OTHER)
Admission: EM | Admit: 2012-08-07 | Discharge: 2012-08-07 | Disposition: A | Discharge status: Home / Self Care | Payer: Self-pay | Attending: Emergency Medicine | Admitting: Emergency Medicine

## 2012-08-07 DIAGNOSIS — S62308A Unspecified fracture of other metacarpal bone, initial encounter for closed fracture: Secondary | ICD-10-CM

## 2012-08-07 MED ORDER — NAPROXEN 375 MG PO TABS: 375.0000 mg | ORAL_TABLET | Freq: Two times a day (BID) | ORAL | Status: DC

## 2012-08-07 NOTE — ED Provider Notes (Signed)
History     CSN: [id number]  Arrival date & time 08/06/12  2349   First MD Initiated Contact with Patient 08/07/12 0013      Chief Complaint  Patient presents with  . Arm Pain    (Consider location/radiation/quality/duration/timing/severity/associated sxs/prior treatment) Patient is a 22 y.o. female presenting with arm pain. The history is provided by the patient. No language interpreter was used.  Arm Pain This is a new problem. The current episode started more than 2 days ago. The problem occurs constantly. The problem has not changed since onset.Pertinent negatives include no chest pain, no abdominal pain, no headaches and no shortness of breath. Nothing aggravates the symptoms. Nothing relieves the symptoms. She has tried nothing for the symptoms. The treatment provided no relief.    Past Medical History  Diagnosis Date  . Headache   . Bartholin cyst   . Back pain     Past Surgical History  Procedure Date  . Eye surgery     AS CHILD   . Ears     TUBES IN EARS   . Suboccipital craniectomy cervical laminectomy 05/13/2011    Procedure: SUBOCCIPITAL CRANIECTOMY CERVICAL LAMINECTOMY/DURAPLASTY;  Surgeon: Carmela Hurt;  Location: [facility];  Service: Neurosurgery;  Laterality: N/A;  CHIARI DECOMPRESSION    History reviewed. No pertinent family history.  History  Substance Use Topics  . Smoking status: Current Every Day Smoker -- 0.2 packs/day  . Smokeless tobacco: Not on file  . Alcohol Use: No    OB History    Grav Para Term Preterm Abortions TAB SAB Ect Mult Living   0               Review of Systems  Respiratory: Negative for shortness of breath.   Cardiovascular: Negative for chest pain.  Gastrointestinal: Negative for abdominal pain.  Neurological: Negative for headaches.  All other systems reviewed and are negative.    Allergies  Coconut oil and Hydrocodone-acetaminophen  Home Medications   Current Outpatient Rx  Name  Route  Sig  Dispense   Refill  . ACETAMINOPHEN 500 MG PO TABS   Oral   Take 1,000 mg by mouth every 6 (six) hours as needed. pain         . OXYCODONE-ACETAMINOPHEN 5-325 MG PO TABS   Oral   Take 1-2 tablets by mouth every 4 (four) hours as needed for pain.   10 tablet   0     BP 118/69  Pulse 80  Temp 98.8 F (37.1 C) (Oral)  SpO2 99%  Physical Exam  Constitutional: She is oriented to person, place, and time. She appears well-developed and well-nourished. No distress.  HENT:  Head: Normocephalic and atraumatic.  Mouth/Throat: Oropharynx is clear and moist.  Eyes: Conjunctivae normal are normal. Pupils are equal, round, and reactive to light.  Neck: Normal range of motion. Neck supple.  Cardiovascular: Normal rate and regular rhythm.   Pulmonary/Chest: Effort normal and breath sounds normal.  Abdominal: Soft. Bowel sounds are normal.  Musculoskeletal: She exhibits tenderness.       Ecchymosis over fourth metacarpal cap refill < 2 sec to all fingers right hand neurovascularly intact  Neurological: She is alert and oriented to person, place, and time.  Skin: Skin is warm and dry.  Psychiatric: She has a normal mood and affect.    ED Course  Procedures (including critical care time)  Labs Reviewed - No data to display Dg Hand Complete Right  08/05/2012  *RADIOLOGY  REPORT*  Clinical Data: Punched wall.  Hand injury and swelling.  RIGHT HAND - COMPLETE 3+ VIEW  Comparison: None.  Findings: Three-view study shows a comminuted fracture of the distal fifth metacarpal with posterior angulation.  No other acute fracture is evident.  No subluxation or dislocation.  IMPRESSION: Comminuted fracture of the distal fifth metacarpal.   Original Report Authenticated By: Kennith Center, M.D.      No diagnosis found.    MDM  Case d/w Dr. Izora Ribas follow up in office        Tiane Szydlowski K Evin Chirco-Rasch, MD 08/07/12 [phone]

## 2012-08-07 NOTE — ED Notes (Signed)
Patient transported to X-ray 

## 2012-09-01 ENCOUNTER — Inpatient Hospital Stay (HOSPITAL_COMMUNITY)
Admission: AD | Admit: 2012-09-01 | Discharge: 2012-09-02 | Disposition: A | Discharge status: Home / Self Care | Payer: Self-pay | Source: Ambulatory Visit | Attending: Obstetrics & Gynecology | Admitting: Obstetrics & Gynecology

## 2012-09-01 DIAGNOSIS — N760 Acute vaginitis: Secondary | ICD-10-CM | POA: Insufficient documentation

## 2012-09-01 DIAGNOSIS — M5432 Sciatica, left side: Secondary | ICD-10-CM

## 2012-09-01 DIAGNOSIS — B9689 Other specified bacterial agents as the cause of diseases classified elsewhere: Secondary | ICD-10-CM | POA: Insufficient documentation

## 2012-09-01 DIAGNOSIS — M5430 Sciatica, unspecified side: Secondary | ICD-10-CM | POA: Insufficient documentation

## 2012-09-01 DIAGNOSIS — N949 Unspecified condition associated with female genital organs and menstrual cycle: Secondary | ICD-10-CM | POA: Insufficient documentation

## 2012-09-01 DIAGNOSIS — N938 Other specified abnormal uterine and vaginal bleeding: Secondary | ICD-10-CM | POA: Insufficient documentation

## 2012-09-01 DIAGNOSIS — A499 Bacterial infection, unspecified: Secondary | ICD-10-CM | POA: Insufficient documentation

## 2012-09-01 DIAGNOSIS — N921 Excessive and frequent menstruation with irregular cycle: Secondary | ICD-10-CM

## 2012-09-01 HISTORY — DX: Reserved for concepts with insufficient information to code with codable children: IMO0002

## 2012-09-01 LAB — POCT PREGNANCY, URINE: Preg Test, Ur: NEGATIVE

## 2012-09-01 NOTE — MAU Note (Signed)
Pt reports she has had her IUD for the last 4 yrs and last week she had a little bleeding and that was the first time she has had a period since the IUD was put in. Today had more bleeding, and also reports off/on pain in left hip and down left leg.

## 2012-09-02 ENCOUNTER — Inpatient Hospital Stay (HOSPITAL_COMMUNITY): Payer: Self-pay

## 2012-09-02 ENCOUNTER — Encounter (HOSPITAL_COMMUNITY): Payer: Self-pay | Admitting: *Deleted

## 2012-09-02 DIAGNOSIS — N921 Excessive and frequent menstruation with irregular cycle: Secondary | ICD-10-CM

## 2012-09-02 DIAGNOSIS — IMO0002 Reserved for concepts with insufficient information to code with codable children: Secondary | ICD-10-CM | POA: Insufficient documentation

## 2012-09-02 DIAGNOSIS — Z975 Presence of (intrauterine) contraceptive device: Secondary | ICD-10-CM

## 2012-09-02 LAB — URINALYSIS, ROUTINE W REFLEX MICROSCOPIC
Glucose, UA: NEGATIVE mg/dL
Leukocytes, UA: NEGATIVE
Protein, ur: NEGATIVE mg/dL
Specific Gravity, Urine: 1.015 (ref 1.005–1.030)
Urobilinogen, UA: 0.2 mg/dL (ref 0.0–1.0)

## 2012-09-02 LAB — WET PREP, GENITAL

## 2012-09-02 LAB — GC/CHLAMYDIA PROBE AMP: GC Probe RNA: NEGATIVE

## 2012-09-02 LAB — URINE MICROSCOPIC-ADD ON

## 2012-09-02 LAB — CBC
Hemoglobin: 12.6 g/dL (ref 12.0–15.0)
MCHC: 34 g/dL (ref 30.0–36.0)
RDW: 12.5 % (ref 11.5–15.5)
WBC: 12.7 10*3/uL — ABNORMAL HIGH (ref 4.0–10.5)

## 2012-09-02 MED ORDER — METRONIDAZOLE 500 MG PO TABS: 500.0000 mg | ORAL_TABLET | Freq: Two times a day (BID) | ORAL | Status: DC

## 2012-09-02 MED ORDER — KETOROLAC TROMETHAMINE 10 MG PO TABS
10.0000 mg | ORAL_TABLET | Freq: Once | ORAL | Status: AC
  Administered 2012-09-02: 10 mg via ORAL
  Filled 2012-09-02: qty 1

## 2012-09-02 MED ORDER — KETOROLAC TROMETHAMINE 10 MG PO TABS: 10.0000 mg | ORAL_TABLET | Freq: Four times a day (QID) | ORAL | Status: DC | PRN

## 2012-09-02 NOTE — MAU Note (Signed)
Pt states she noticed vaginal bleeding last week for a week. Like she would if she had a period. Pt states she had intercourse today and noticed about 4 hours later pt noticed bleeding and felt she had to put on a pad

## 2012-09-02 NOTE — MAU Provider Note (Signed)
Chief Complaint: Vaginal Bleeding   First Provider Initiated Contact with Patient 09/02/12 0105     SUBJECTIVE HPI: Taylor Garcia is a 22 y.o. G0P0 with Mirena IUD in place x 4 years who presents with light vaginal bleeding last week and moderate bleeding today starting 4 hour after IC. She has not had periods since IUD was inserted. She also reports discomfort w/ IC x 2 years  Past Medical History  Diagnosis Date  . Headache   . Bartholin cyst   . Back pain   . Chiari malformation    OB History   Grav Para Term Preterm Abortions TAB SAB Ect Mult Living   0              Past Surgical History  Procedure Laterality Date  . Eye surgery      AS CHILD   . Ears      TUBES IN EARS   . Suboccipital craniectomy cervical laminectomy  05/13/2011    Procedure: SUBOCCIPITAL CRANIECTOMY CERVICAL LAMINECTOMY/DURAPLASTY;  Surgeon: Carmela Hurt;  Location: [facility];  Service: Neurosurgery;  Laterality: N/A;  CHIARI DECOMPRESSION   History   Social History  . Marital Status: Single    Spouse Name: N/A    Number of Children: N/A  . Years of Education: N/A   Occupational History  . Not on file.   Social History Main Topics  . Smoking status: Current Every Day Smoker -- 0.25 packs/day  . Smokeless tobacco: Not on file  . Alcohol Use: No  . Drug Use: No  . Sexually Active: Yes    Birth Control/ Protection: Implant   Other Topics Concern  . Not on file   Social History Narrative  . No narrative on file   No current facility-administered medications on file prior to encounter.   Current Outpatient Prescriptions on File Prior to Encounter  Medication Sig Dispense Refill  . acetaminophen (TYLENOL) 500 MG tablet Take 1,000 mg by mouth every 6 (six) hours as needed. pain      . naproxen (NAPROSYN) 375 MG tablet Take 1 tablet (375 mg total) by mouth 2 (two) times daily.  20 tablet  0  . oxyCODONE-acetaminophen (PERCOCET) 5-325 MG per tablet Take 1-2 tablets by mouth every 4  (four) hours as needed for pain.  10 tablet  0   Allergies  Allergen Reactions  . Hydrocodone-Acetaminophen Other (See Comments)    VICODIN Reaction:" vomits Blood"    ROS: Pertinent items in HPI  OBJECTIVE Blood pressure 119/65, pulse 89, temperature 98.3 F (36.8 C), temperature source Oral, resp. rate 18, height 5\' 4"  (1.626 m), weight 87.091 kg (192 lb), last menstrual period 08/25/2012, SpO2 100.00%. GENERAL: Well-developed, well-nourished female in mild distress, moderate distress w/ position change to to left low back pain.  HEENT: Normocephalic HEART: normal rate RESP: normal effort ABDOMEN: Soft, non-tender EXTREMITIES: Nontender, no edema NEURO: Alert and oriented SPECULUM EXAM: NEFG, small amount of bright red blood noted, cervix clean. IUD string seen. IUD not seen protruding through os. BIMANUAL: cervix closed; IUD not palpated at os. Uterus normal size, no adnexal tenderness or masses. No CMT.  LAB RESULTS Results for orders placed during the hospital encounter of 09/01/12 (from the past 24 hour(s))  URINALYSIS, ROUTINE W REFLEX MICROSCOPIC     Status: Abnormal   Collection Time    09/01/12 11:43 PM      Result Value Range   Color, Urine YELLOW  YELLOW   APPearance CLOUDY (*)  CLEAR   Specific Gravity, Urine 1.015  1.005 - 1.030   pH 6.0  5.0 - 8.0   Glucose, UA NEGATIVE  NEGATIVE mg/dL   Hgb urine dipstick LARGE (*) NEGATIVE   Bilirubin Urine NEGATIVE  NEGATIVE   Ketones, ur NEGATIVE  NEGATIVE mg/dL   Protein, ur NEGATIVE  NEGATIVE mg/dL   Urobilinogen, UA 0.2  0.0 - 1.0 mg/dL   Nitrite NEGATIVE  NEGATIVE   Leukocytes, UA NEGATIVE  NEGATIVE  URINE MICROSCOPIC-ADD ON     Status: Abnormal   Collection Time    09/01/12 11:43 PM      Result Value Range   Squamous Epithelial / LPF MANY (*) RARE   WBC, UA 3-6  <3 WBC/hpf   Bacteria, UA FEW (*) RARE  POCT PREGNANCY, URINE     Status: None   Collection Time    09/02/12 12:00 AM      Result Value Range   Preg  Test, Ur NEGATIVE  NEGATIVE  CBC     Status: Abnormal   Collection Time    09/02/12 12:15 AM      Result Value Range   WBC 12.7 (*) 4.0 - 10.5 K/uL   RBC 4.23  3.87 - 5.11 MIL/uL   Hemoglobin 12.6  12.0 - 15.0 g/dL   HCT 16.1  09.6 - 04.5 %   MCV 87.7  78.0 - 100.0 fL   MCH 29.8  26.0 - 34.0 pg   MCHC 34.0  30.0 - 36.0 g/dL   RDW 40.9  81.1 - 91.4 %   Platelets 254  150 - 400 K/uL  WET PREP, GENITAL     Status: Abnormal   Collection Time    09/02/12  1:20 AM      Result Value Range   Yeast Wet Prep HPF POC NONE SEEN  NONE SEEN   Trich, Wet Prep NONE SEEN  NONE SEEN   Clue Cells Wet Prep HPF POC MODERATE (*) NONE SEEN   WBC, Wet Prep HPF POC FEW (*) NONE SEEN    IMAGING US Transvaginal Non-ob  09/02/2012  *RADIOLOGY REPORT*  Clinical Data: Pelvic pain and bleeding.  TRANSABDOMINAL AND TRANSVAGINAL ULTRASOUND OF PELVIS Technique:  Both transabdominal and transvaginal ultrasound examinations of the pelvis were performed. Transabdominal technique was performed for global imaging of the pelvis including uterus, ovaries, adnexal regions, and pelvic cul-de-sac.  It was necessary to proceed with endovaginal exam following the transabdominal exam to visualize the uterus and ovaries in greater detail.  Comparison:  Pelvic ultrasound performed 02/18/2012  Findings:  Uterus: Normal in size and appearance; measures 7.0 x 2.9 x 3.9 cm.  Endometrium: Normal in thickness and appearance; measures 0.3 cm in thickness.  The patient's intrauterine device is noted in expected position, at the fundus of the uterus.  Right ovary:  Normal appearance/no adnexal mass; measures 3.1 x 2.4 x 1.9 cm.  Left ovary: Normal appearance/no adnexal mass; measures 2.9 x 2.5 x 2.5 cm.  Other findings: No free fluid is seen within the pelvic cul-de-sac.  IMPRESSION: Normal study.  No evidence of pelvic mass or other significant abnormality.  The patient's intrauterine device is noted in expected position, at the fundus of the  uterus.   Original Report Authenticated By: Tonia Ghent, M.D.    US Pelvis Complete  09/02/2012  *RADIOLOGY REPORT*  Clinical Data: Pelvic pain and bleeding.  TRANSABDOMINAL AND TRANSVAGINAL ULTRASOUND OF PELVIS Technique:  Both transabdominal and transvaginal ultrasound examinations of the pelvis were performed.  Transabdominal technique was performed for global imaging of the pelvis including uterus, ovaries, adnexal regions, and pelvic cul-de-sac.  It was necessary to proceed with endovaginal exam following the transabdominal exam to visualize the uterus and ovaries in greater detail.  Comparison:  Pelvic ultrasound performed 02/18/2012  Findings:  Uterus: Normal in size and appearance; measures 7.0 x 2.9 x 3.9 cm.  Endometrium: Normal in thickness and appearance; measures 0.3 cm in thickness.  The patient's intrauterine device is noted in expected position, at the fundus of the uterus.  Right ovary:  Normal appearance/no adnexal mass; measures 3.1 x 2.4 x 1.9 cm.  Left ovary: Normal appearance/no adnexal mass; measures 2.9 x 2.5 x 2.5 cm.  Other findings: No free fluid is seen within the pelvic cul-de-sac.  IMPRESSION: Normal study.  No evidence of pelvic mass or other significant abnormality.  The patient's intrauterine device is noted in expected position, at the fundus of the uterus.   Original Report Authenticated By: Tonia Ghent, M.D.    MAU COURSE  ASSESSMENT 1. Breakthrough bleeding with IUD   2. Vaginal bacteriosis   3. Chronic sciatica, left    PLAN Discharge home GC/CT pending. F/U w/ PCP RE: sciatica. Discussed comfort measures, PT.     Follow-up Information   Follow up with Gynecologist. (for annual exam and  as needed if symptoms worsen)        Medication List    TAKE these medications       acetaminophen 500 MG tablet  Commonly known as:  TYLENOL  Take 1,000 mg by mouth every 6 (six) hours as needed. pain     ketorolac 10 MG tablet  Commonly known as:  TORADOL   Take 1 tablet (10 mg total) by mouth every 6 (six) hours as needed for pain.     metroNIDAZOLE 500 MG tablet  Commonly known as:  FLAGYL  Take 1 tablet (500 mg total) by mouth 2 (two) times daily.     naproxen 375 MG tablet  Commonly known as:  NAPROSYN  Take 1 tablet (375 mg total) by mouth 2 (two) times daily.     oxyCODONE-acetaminophen 5-325 MG per tablet  Commonly known as:  PERCOCET  Take 1-2 tablets by mouth every 4 (four) hours as needed for pain.       [place], CNM 09/02/2012  3:21 AM

## 2012-09-02 NOTE — Progress Notes (Signed)
p t states pain is in her hip and her left cheek and goes down to about the middle of her leg"it feels like it is pressing on something'

## 2012-09-03 LAB — URINE CULTURE

## 2012-09-07 ENCOUNTER — Other Ambulatory Visit: Payer: Self-pay | Admitting: Advanced Practice Midwife

## 2012-09-07 DIAGNOSIS — N390 Urinary tract infection, site not specified: Secondary | ICD-10-CM

## 2012-09-07 MED ORDER — SULFAMETHOXAZOLE-TRIMETHOPRIM 800-160 MG PO TABS: 1.0000 | ORAL_TABLET | Freq: Two times a day (BID) | ORAL | Status: DC

## 2012-11-30 DIAGNOSIS — Z8742 Personal history of other diseases of the female genital tract: Secondary | ICD-10-CM | POA: Insufficient documentation

## 2012-11-30 DIAGNOSIS — IMO0002 Reserved for concepts with insufficient information to code with codable children: Secondary | ICD-10-CM | POA: Insufficient documentation

## 2012-11-30 DIAGNOSIS — F17200 Nicotine dependence, unspecified, uncomplicated: Secondary | ICD-10-CM | POA: Insufficient documentation

## 2012-11-30 DIAGNOSIS — S0990XA Unspecified injury of head, initial encounter: Secondary | ICD-10-CM | POA: Insufficient documentation

## 2012-11-30 DIAGNOSIS — Z8739 Personal history of other diseases of the musculoskeletal system and connective tissue: Secondary | ICD-10-CM | POA: Insufficient documentation

## 2012-11-30 DIAGNOSIS — Y929 Unspecified place or not applicable: Secondary | ICD-10-CM | POA: Insufficient documentation

## 2012-11-30 DIAGNOSIS — Y939 Activity, unspecified: Secondary | ICD-10-CM | POA: Insufficient documentation

## 2012-11-30 NOTE — ED Notes (Addendum)
PT. HIT WITH A BASKETBALL AT LEFT SIDE OF HEAD LAST SATURDAY NO LOC/AMBULATORY , PT. REPORTS LEFT SIDE HEADACHE WITH NAUSEA AND VOMITTING .

## 2012-12-01 ENCOUNTER — Emergency Department (HOSPITAL_COMMUNITY)
Admit: 2012-12-01 | Discharge: 2012-12-01 | Disposition: A | Discharge status: Home / Self Care | Payer: Self-pay | Attending: Emergency Medicine | Admitting: Emergency Medicine

## 2012-12-01 ENCOUNTER — Encounter (HOSPITAL_COMMUNITY): Payer: Self-pay | Admitting: Emergency Medicine

## 2012-12-01 ENCOUNTER — Emergency Department (HOSPITAL_COMMUNITY)
Admission: EM | Admit: 2012-12-01 | Discharge: 2012-12-01 | Disposition: A | Discharge status: Home / Self Care | Payer: Self-pay | Attending: Emergency Medicine | Admitting: Emergency Medicine

## 2012-12-01 ENCOUNTER — Encounter (HOSPITAL_COMMUNITY): Payer: Self-pay

## 2012-12-01 DIAGNOSIS — W2105XA Struck by basketball, initial encounter: Secondary | ICD-10-CM

## 2012-12-01 MED ORDER — OXYCODONE-ACETAMINOPHEN 5-325 MG PO TABS
2.0000 | ORAL_TABLET | Freq: Once | ORAL | Status: DC
  Filled 2012-12-01: qty 2

## 2012-12-01 MED ORDER — ONDANSETRON 4 MG PO TBDP
4.0000 mg | ORAL_TABLET | Freq: Once | ORAL | Status: DC
  Filled 2012-12-01: qty 1

## 2012-12-01 MED ORDER — OXYCODONE-ACETAMINOPHEN 5-325 MG PO TABS: 1.0000 | ORAL_TABLET | Freq: Four times a day (QID) | ORAL | Status: DC | PRN

## 2012-12-01 NOTE — ED Notes (Signed)
Pt states that she is able to take hydrocodone without problems but vicodin causes her to "vomit blood"

## 2012-12-01 NOTE — ED Notes (Signed)
Pt stormed out of room angry and left visitor at bedside, pt was upset that MD woke her up to update her per visitor, pt left prior to receiving discharge instructions.

## 2012-12-01 NOTE — ED Notes (Signed)
MD at bedside. 

## 2012-12-03 NOTE — ED Provider Notes (Signed)
History     CSN: [id number]  Arrival date & time 11/30/12  2332   First MD Initiated Contact with Patient 12/01/12 [phone]      Chief Complaint  Patient presents with  . Head Injury   HPI Taylor Garcia is a 22 y.o. female is in the emergency department with a headache that is throbbing, all over her head, she blames this on a being hit in the head with a basketball last Saturday, she claims there is depression where it was hit on the left side of her head. She's had some associated nausea and vomiting. Denies any numbness or tingling, chest pain, shortness of breath, abdominal pain.    Past Medical History  Diagnosis Date  . Headache(784.0)   . Bartholin cyst   . Back pain   . Chiari malformation     Past Surgical History  Procedure Laterality Date  . Eye surgery      AS CHILD   . Ears      TUBES IN EARS   . Suboccipital craniectomy cervical laminectomy  05/13/2011    Procedure: SUBOCCIPITAL CRANIECTOMY CERVICAL LAMINECTOMY/DURAPLASTY;  Surgeon: Carmela Hurt;  Location: [facility];  Service: Neurosurgery;  Laterality: N/A;  CHIARI DECOMPRESSION    No family history on file.  History  Substance Use Topics  . Smoking status: Current Every Day Smoker -- 0.25 packs/day  . Smokeless tobacco: Not on file  . Alcohol Use: No    OB History   Grav Para Term Preterm Abortions TAB SAB Ect Mult Living   0               Review of Systems At least 10pt or greater review of systems completed and are negative except where specified in the HPI.  Allergies  Hydrocodone-acetaminophen  Home Medications   Current Outpatient Rx  Name  Route  Sig  Dispense  Refill  . oxyCODONE-acetaminophen (PERCOCET/ROXICET) 5-325 MG per tablet   Oral   Take 1-2 tablets by mouth every 6 (six) hours as needed for pain.   17 tablet   0     BP 134/67  Pulse 96  Temp(Src) 98.5 F (36.9 C) (Oral)  Resp 16  SpO2 96%  Physical Exam  Nursing notes reviewed.  Electronic medical record  reviewed. VITAL SIGNS:   Filed Vitals:   11/30/12 2348  BP: 134/67  Pulse: 96  Temp: 98.5 F (36.9 C)  TempSrc: Oral  Resp: 16  SpO2: 96%   CONSTITUTIONAL: Awake, oriented, appears non-toxic HENT: Atraumatic, normocephalic, oral mucosa pink and moist, airway patent. Nares patent without drainage. External ears normal. EYES: Conjunctiva clear, EOMI, PERRLA NECK: Trachea midline, non-tender, supple CARDIOVASCULAR: Normal heart rate, Normal rhythm, No murmurs, rubs, gallops PULMONARY/CHEST: Clear to auscultation, no rhonchi, wheezes, or rales. Symmetrical breath sounds. Non-tender. ABDOMINAL: Non-distended, soft, non-tender - no rebound or guarding.  BS normal. NEUROLOGIC: Non-focal, moving all four extremities, no gross sensory or motor deficits. EXTREMITIES: No clubbing, cyanosis, or edema SKIN: Warm, Dry, No erythema, No rash  ED Course  Procedures (including critical care time)  Labs Reviewed - No data to display No results found.   1. Struck by basketball, initial encounter   2. Headache     Medications - No data to display   MDM   patient says she was struck by a basketball days ago but comes in the emergency department today complaining of a headache. Patient is evidently upset about waiting in the emergency department and left  prior to any treatment being administered. Patient is neurologically intact, do not think this patient needs any imaging or further workup. Patient does not have any neurologic emergency at this time, she may be having headache, I do not think is related to being hit in the head of the basketball days ago. I do not think she's got intracranial hemorrhage.        Jones Skene, MD 12/03/12 [phone]

## 2013-02-21 ENCOUNTER — Encounter (HOSPITAL_COMMUNITY): Payer: Self-pay | Admitting: *Deleted

## 2013-02-21 ENCOUNTER — Emergency Department (HOSPITAL_COMMUNITY)
Admission: EM | Admit: 2013-02-21 | Discharge: 2013-02-21 | Disposition: A | Discharge status: Home / Self Care | Payer: Self-pay | Attending: Emergency Medicine | Admitting: Emergency Medicine

## 2013-02-21 DIAGNOSIS — M545 Low back pain, unspecified: Secondary | ICD-10-CM | POA: Insufficient documentation

## 2013-02-21 DIAGNOSIS — Z8742 Personal history of other diseases of the female genital tract: Secondary | ICD-10-CM | POA: Insufficient documentation

## 2013-02-21 DIAGNOSIS — F17200 Nicotine dependence, unspecified, uncomplicated: Secondary | ICD-10-CM | POA: Insufficient documentation

## 2013-02-21 DIAGNOSIS — M5430 Sciatica, unspecified side: Secondary | ICD-10-CM | POA: Insufficient documentation

## 2013-02-21 DIAGNOSIS — M5432 Sciatica, left side: Secondary | ICD-10-CM

## 2013-02-21 DIAGNOSIS — Z79899 Other long term (current) drug therapy: Secondary | ICD-10-CM | POA: Insufficient documentation

## 2013-02-21 MED ORDER — TRAMADOL HCL 50 MG PO TABS: 50.0000 mg | ORAL_TABLET | Freq: Four times a day (QID) | ORAL | Status: DC | PRN

## 2013-02-21 MED ORDER — PREDNISONE 20 MG PO TABS: 40.0000 mg | ORAL_TABLET | Freq: Every day | ORAL | Status: DC

## 2013-02-21 MED ORDER — MELOXICAM 7.5 MG PO TABS: 15.0000 mg | ORAL_TABLET | Freq: Every day | ORAL | Status: DC

## 2013-02-21 NOTE — ED Notes (Signed)
Pt states she has had the same pain for a year. Pt states pain starts in lower back and travels down the back of her left thigh. Pt states the pain is intermittant and usually goes away after a couple of days but this time the pain has been for a week. Pt states that she has not been seen for the pain. And she has tried OTC pain medications with no relief. Pt ambulatory.

## 2013-02-21 NOTE — ED Provider Notes (Signed)
CSN: [id number]     Arrival date & time 02/21/13  1947 History  This chart was scribed for non-physician practitioner Antony Madura, PA-C working with Shanna Cisco, MD by Danella Maiers, ED Scribe. This patient was seen in room TR06C/TR06C and the patient's care was started at 9:30 PM.    Chief Complaint  Patient presents with  . Back Pain    Patient is a 22 y.o. female presenting with back pain. The history is provided by the patient. No language interpreter was used.  Back Pain Radiates to:  L thigh Pain severity:  Moderate Onset quality:  Gradual Duration:  6 months Timing:  Constant Progression:  Worsening Relieved by:  Nothing Worsened by:  Nothing tried Ineffective treatments:  Heating pad Associated symptoms: no dysuria and no numbness    HPI Comments: Taylor Garcia is a 22 y.o. female who presents to the Emergency Department complaining of ongoing, constant, sharp lower back pain that radiates to left posterior thigh onset 6 months ago that has worsened in the last week. She has never been seen for it or given a referral to see a specialist. She denies trauma or injury to her back. She reports that walking makes the pain worse. She has tried naproxen, ice and a heating pad with no relief. She denies urinary and bowel incontinence, numbness to the anogenital region or lower thighs, dysuria, vaginal bleeding, vaginal discharge, or hematuria. Pt denies history of cancer or IV drug use.   Past Medical History  Diagnosis Date  . Headache(784.0)   . Bartholin cyst   . Back pain   . Chiari malformation    Past Surgical History  Procedure Laterality Date  . Eye surgery      AS CHILD   . Ears      TUBES IN EARS   . Suboccipital craniectomy cervical laminectomy  05/13/2011    Procedure: SUBOCCIPITAL CRANIECTOMY CERVICAL LAMINECTOMY/DURAPLASTY;  Surgeon: Carmela Hurt;  Location: [facility];  Service: Neurosurgery;  Laterality: N/A;  CHIARI DECOMPRESSION   History  reviewed. No pertinent family history. History  Substance Use Topics  . Smoking status: Current Every Day Smoker -- 0.25 packs/day  . Smokeless tobacco: Not on file  . Alcohol Use: No   OB History   Grav Para Term Preterm Abortions TAB SAB Ect Mult Living   0              Review of Systems  Genitourinary: Negative for dysuria, hematuria, vaginal bleeding and vaginal discharge.  Musculoskeletal: Positive for back pain.  Neurological: Negative for numbness.  All other systems reviewed and are negative.   Allergies  Hydrocodone-acetaminophen  Home Medications   Current Outpatient Rx  Name  Route  Sig  Dispense  Refill  . naproxen (NAPROSYN) 500 MG tablet   Oral   Take 500 mg by mouth 2 (two) times daily with a meal.         . meloxicam (MOBIC) 7.5 MG tablet   Oral   Take 2 tablets (15 mg total) by mouth daily.   30 tablet   0   . predniSONE (DELTASONE) 20 MG tablet   Oral   Take 2 tablets (40 mg total) by mouth daily.   10 tablet   0   . traMADol (ULTRAM) 50 MG tablet   Oral   Take 1 tablet (50 mg total) by mouth every 6 (six) hours as needed for pain.   15 tablet   0  BP 121/60  Pulse 67  Temp(Src) 97.6 F (36.4 C) (Oral)  Resp 16  SpO2 97% Physical Exam  Nursing note and vitals reviewed. Constitutional: She is oriented to person, place, and time. She appears well-developed and well-nourished. No distress.  HENT:  Head: Normocephalic and atraumatic.  Eyes: Conjunctivae and EOM are normal. No scleral icterus.  Neck: Normal range of motion.  Cardiovascular: Normal rate, regular rhythm and intact distal pulses.   Pulses:      Radial pulses are 2+ on the right side, and 2+ on the left side.       Dorsalis pedis pulses are 2+ on the right side, and 2+ on the left side.  Pulmonary/Chest: Effort normal. No respiratory distress.  Musculoskeletal: Normal range of motion.  No bony deformities or step-offs palpated. No TTP of lumbosacral midline. TTP of left  lumbosacral parospinal muscles. No spasms.  Neurological: She is alert and oriented to person, place, and time. She has normal reflexes. She displays normal reflexes.  Positive straight leg raise. Negative cross straight leg raise. DTRs normal and symmetric. No sensory motor deficits appreciated. Patient ambulatory with normal gait without assistance.  Skin: Skin is warm and dry. No rash noted. She is not diaphoretic. No erythema. No pallor.  Psychiatric: She has a normal mood and affect. Her behavior is normal.    ED Course  Medications - No data to display  DIAGNOSTIC STUDIES: Oxygen Saturation is 97% on room air, normal by my interpretation.    COORDINATION OF CARE: 9:35 PM- Discussed treatment plan with pt and pt agrees to plan.  Procedures (including critical care time)  Labs Reviewed - No data to display No results found.  1. Low back pain   2. Sciatica, left    MDM  Low back pain and sciatica; uncomplicated - Patient well and nontoxic appearing, hemodynamically stable, and afebrile. She is ambulatory with normal gait and without assistance. Patient neurovascularly intact on physical exam. No tenderness to palpation of the lumbosacral midline; no bony deformities or step-offs palpated. No history of falls or trauma inciting pain. No red flags or signs concerning for cauda equina. No history of cancer or IV drug use. Patient appropriate for discharge with orthopedic followup for further evaluation of symptoms. Prescriptions for prednisone, Mobic, and tramadol provided. Indications for ED return discussed and patient are agreeable to plan with no unaddressed concerns.  I personally performed the services described in this documentation, which was scribed in my presence. The recorded information has been reviewed and is accurate.     Antony Madura, PA-C 02/22/13 1448

## 2013-02-24 NOTE — ED Provider Notes (Signed)
Medical screening examination/treatment/procedure(s) were performed by non-physician practitioner and as supervising physician I was immediately available for consultation/collaboration.   Shanna Cisco, MD 02/24/13 [phone]

## 2013-07-11 ENCOUNTER — Encounter (HOSPITAL_COMMUNITY): Payer: Self-pay | Admitting: Emergency Medicine

## 2013-07-11 ENCOUNTER — Emergency Department (INDEPENDENT_AMBULATORY_CARE_PROVIDER_SITE_OTHER): Payer: Self-pay

## 2013-07-11 ENCOUNTER — Emergency Department (INDEPENDENT_AMBULATORY_CARE_PROVIDER_SITE_OTHER)
Admission: EM | Admit: 2013-07-11 | Discharge: 2013-07-11 | Disposition: A | Discharge status: Home / Self Care | Payer: Self-pay | Source: Home / Self Care | Attending: Family Medicine | Admitting: Family Medicine

## 2013-07-11 DIAGNOSIS — S92253A Displaced fracture of navicular [scaphoid] of unspecified foot, initial encounter for closed fracture: Secondary | ICD-10-CM

## 2013-07-11 DIAGNOSIS — S62309A Unspecified fracture of unspecified metacarpal bone, initial encounter for closed fracture: Secondary | ICD-10-CM

## 2013-07-11 DIAGNOSIS — S92252A Displaced fracture of navicular [scaphoid] of left foot, initial encounter for closed fracture: Secondary | ICD-10-CM

## 2013-07-11 MED ORDER — TRAMADOL HCL 50 MG PO TABS: 50.0000 mg | ORAL_TABLET | Freq: Four times a day (QID) | ORAL | Status: DC | PRN

## 2013-07-11 NOTE — ED Provider Notes (Signed)
CSN: [id number]     Arrival date & time 07/11/13  1327 History   First MD Initiated Contact with Patient 07/11/13 1527     Chief Complaint  Patient presents with  . Ankle Injury   (Consider location/radiation/quality/duration/timing/severity/associated sxs/prior Treatment) HPI Comments: 23 year old female presents complaining of possible left foot injury sustained earlier while playing basketball. She was playing basketball when she fell on her foot wrong. Her foot rolled up under and she felt a pop in the top of her foot. She had immediate severe pain that has been made worse with any ambulation. The pain is minimal as long as she is not bearing weight on the foot. She feels like her foot may be getting swollen as well. She has a history of multiple injuries to this foot, mostly sustained during other sporting activities. No other injuries at this time.   Past Medical History  Diagnosis Date  . Headache(784.0)   . Bartholin cyst   . Back pain   . Chiari malformation    Past Surgical History  Procedure Laterality Date  . Eye surgery      AS CHILD   . Ears      TUBES IN EARS   . Suboccipital craniectomy cervical laminectomy  05/13/2011    Procedure: SUBOCCIPITAL CRANIECTOMY CERVICAL LAMINECTOMY/DURAPLASTY;  Surgeon: Carmela HurtKyle L Cabbell;  Location: [facility];  Service: Neurosurgery;  Laterality: N/A;  CHIARI DECOMPRESSION   History reviewed. No pertinent family history. History  Substance Use Topics  . Smoking status: Current Every Day Smoker -- 0.25 packs/day  . Smokeless tobacco: Not on file  . Alcohol Use: No   OB History   Grav Para Term Preterm Abortions TAB SAB Ect Mult Living   0              Review of Systems  Constitutional: Negative for fever and chills.  Eyes: Negative for visual disturbance.  Respiratory: Negative for cough and shortness of breath.   Cardiovascular: Negative for chest pain, palpitations and leg swelling.  Gastrointestinal: Negative for nausea,  vomiting and abdominal pain.  Endocrine: Negative for polydipsia and polyuria.  Genitourinary: Negative for dysuria, urgency and frequency.  Musculoskeletal: Positive for arthralgias and gait problem.       See history of present illness  Skin: Negative for rash.  Neurological: Negative for dizziness, weakness and light-headedness.    Allergies  Hydrocodone-acetaminophen  Home Medications   Current Outpatient Rx  Name  Route  Sig  Dispense  Refill  . meloxicam (MOBIC) 7.5 MG tablet   Oral   Take 2 tablets (15 mg total) by mouth daily.   30 tablet   0   . naproxen (NAPROSYN) 500 MG tablet   Oral   Take 500 mg by mouth 2 (two) times daily with a meal.         . predniSONE (DELTASONE) 20 MG tablet   Oral   Take 2 tablets (40 mg total) by mouth daily.   10 tablet   0   . traMADol (ULTRAM) 50 MG tablet   Oral   Take 1 tablet (50 mg total) by mouth every 6 (six) hours as needed.   30 tablet   0    BP 101/60  Pulse 78  Temp(Src) 98.4 F (36.9 C) (Oral)  Resp 18  SpO2 100% Physical Exam  Nursing note and vitals reviewed. Constitutional: She is oriented to person, place, and time. Vital signs are normal. She appears well-developed and well-nourished. No distress.  HENT:  Head: Normocephalic and atraumatic.  Pulmonary/Chest: Effort normal. No respiratory distress.  Musculoskeletal:       Left ankle: She exhibits swelling (minimal). She exhibits no ecchymosis and no deformity. Tenderness. AITFL tenderness found.       Feet:  Neurological: She is alert and oriented to person, place, and time. She has normal strength. Coordination normal.  Skin: Skin is warm and dry. No rash noted. She is not diaphoretic.  Psychiatric: She has a normal mood and affect. Judgment normal.    ED Course  Procedures (including critical care time) Labs Review Labs Reviewed - No data to display Imaging Review Dg Ankle Complete Left  07/11/2013   CLINICAL DATA:  Basketball injury, pain   EXAM: LEFT ANKLE COMPLETE - 3+ VIEW  COMPARISON:  07/11/2013  FINDINGS: Normal ankle alignment. Malleoli, talus and calcaneus appear intact. Remote trauma of the dorsal talus surface. Osseous irregularity noted of the dorsal navicular surface suspicious for a small avulsion fracture. Slight soft tissue swelling in this region.  IMPRESSION: Findings suspicious for a small avulsion fracture of the dorsal navicular surface.   Electronically Signed   By: Ruel Favors M.D.   On: 07/11/2013 15:06   Dg Foot Complete Left  07/11/2013   CLINICAL DATA:  Limited range of motion, pain, twisting injury playing basketball  EXAM: LEFT FOOT - COMPLETE 3+ VIEW  COMPARISON:  09/15/2009, 07/11/2012  FINDINGS: Normal alignment. Preserved joint spaces. No significant arthropathy. On the lateral view, there is osseous irregularity along the dorsal cortical surface of the navicular suspicious for a small avulsion fracture. Remote trauma noted of the dorsal talus surface.  IMPRESSION: Possible small avulsion fracture of the dorsal surface of the navicular.   Electronically Signed   By: Ruel Favors M.D.   On: 07/11/2013 15:04      MDM   1. Navicular fracture, left, closed, initial encounter    Possible fracture via x-ray. Walker boot and crutches. RICE.  Tramadol when necessary. Non-weight-bearing until Followup with sports medicine  Meds ordered this encounter  Medications  . traMADol (ULTRAM) 50 MG tablet    Sig: Take 1 tablet (50 mg total) by mouth every 6 (six) hours as needed.    Dispense:  30 tablet    Refill:  0    Order Specific Question:  Supervising Provider    Answer:  Bradd Canary D [[id number]]       Graylon Good, PA-C 07/11/13 2133

## 2013-07-11 NOTE — ED Notes (Signed)
C/o left ankle injury States she was playing basketball when she came down wrong on her foot States ankle and foot is sore Admits to having numerous injuries on this foot in the past.

## 2013-07-11 NOTE — Discharge Instructions (Signed)
Fracture A fracture is a break in a bone, due to a force on the bone that is greater than the bone's strength can handle. There are many types of fractures, including:  Complete fracture: The break passes completely through the bone.  Displaced: The ends of the bone fragments are not properly aligned.  Non-displaced: The ends of the bone fragments are in proper alignment.  Incomplete fracture (greenstick): The break does not pass completely through the bone. Incomplete fractures may or may not be angular (angulated).  Open fracture (compound): Part of the broken bone pokes through the skin. Open fractures have a high risk for infection.  Closed fracture: The fracture has not broken through the skin.  Comminuted fracture: The bone is broken into more than two pieces.  Compression fracture: The break occurs from extreme pressure on the bone (includes crushing injury).  Impacted fracture: The broken bone ends have been driven into each other.  Avulsion fracture: A ligament or tendon pulls a small piece of bone off from the main bony segment.  Pathologic fracture: A fracture due to the bone being made weak by a disease (osteoporosis or tumors).  Stress fracture: A fracture caused by intense exercise or repetitive and prolonged pressure that makes the bone weak. SYMPTOMS   Pain, tenderness, bleeding, bruising, and swelling at the fracture site.  Weakness and inability to bear weight on the injured extremity.  Paleness and deformity (sometimes).  Loss of pulse, numbness, tingling, or paralysis below the fracture site (usually a limb); these are emergencies. CAUSES  Bone being subjected to a force greater than its strength. RISK INCREASES WITH:  Contact sports and falls from heights.  Previous or current bone problems (osteoporosis or tumors).  Poor balance.  Poor strength and flexibility. PREVENTION   Warm up and stretch properly before activity.  Maintain physical  fitness:  Cardiovascular fitness.  Muscle strength.  Flexibility and endurance.  Wear proper protective equipment.  Use proper exercise technique. RELATED COMPLICATIONS   Bone fails to heal (nonunion).  Bone heals in a poor position (malunion).  Low blood volume (hypovolemic), shock due to blood loss.  Clump of fat cells travels through the blood (fat embolus) from the injury site to the lungs or brain (more common with thigh fractures).  Obstruction of nearby arteries. TREATMENT  Treatment first requires realigning of the bones (reduction) by a medically trained person, if the fracture is displaced. After realignment if the fracture is completed, or for non-displaced fractures, ice and medicine are used to reduce pain and inflammation. The bone and adjacent joints are then restrained with a splint, cast, or brace to allow the bones to heal without moving. Surgery is sometimes needed, to reposition the bones and hold the position with rods, pins, plates, or screws. Restraint for long periods of time may result in muscle and joint weakness or build up of fluid in tissues (edema). For this reason, physical therapy is often needed to regain strength and full range of motion. Recovery is complete when there is no bone motion at the fracture site and x-rays (radiographs) show complete healing.  MEDICATION   General anesthesia, sedation, or muscle relaxants may be needed to allow for realignment of the fracture. If pain medicine is needed, nonsteroidal anti-inflammatory medicines (aspirin and ibuprofen), or other minor pain relievers (acetaminophen), are often advised.  Do not take pain medicine for 7 days before surgery.  Stronger pain relievers may be prescribed by your caregiver. Use only as directed and only as much   as you need. SEEK MEDICAL CARE IF:   The following occur after restraint or surgery. (Report any of these signs immediately):  Swelling above or below the fracture  site.  Severe, persistent pain.  Blue or gray skin below the fracture site, especially under the nails. Numbness or loss of feeling below the fracture site. Document Released: 06/17/2005 Document Revised: 06/03/2012 Document Reviewed: 09/29/2008 [facility] Patient Information 2014 [employer], [place].  Tarsal Navicular Fracture A fracture is a break in the bone. The tarsal navicular is a moderate sized bone of the midfoot on the inner side of the foot. This bone can be fractured in several ways and there are many different ways of treating these fractures. Some of the ways of treating these are as follows: TREATMENT   Immobilization, which means the fracture is casted as it is without changing the positions of the fracture (bone pieces) involved. This procedure is used when the bone fragments remain in their normal position (are not displaced).  ORIF (open reduction and internal fixation), in which the fracture site is opened and the bone pieces are fixed into place with some type of hardware (screws or pins). This type of repair is usually used when the body of the navicular is fractured. It is indicated in most fractures that are displaced. Your caregiver will discuss the type of fracture you have and the treatment that will be best for that problem. If surgery is the treatment of choice, the following is information for you to know and also let your caregiver know about prior to surgery.  LET YOUR CAREGIVER KNOW ABOUT:  Allergies.  Medicine taken including herbs, eye drops, over the counter medications, and creams.  Use of steroids (by mouth or creams)  Previous problems with anesthetics or novocaine.  Possibility of pregnancy, if this applies.  History of blood clots (thrombophlebitis).  History of bleeding or blood problems.  Previous surgery.  Other health problems.  Family history of anesthetic problems. AFTER THE PROCEDURE After surgery, you will be taken to the recovery area  where a nurse will watch and check your progress. Once you are awake, stable, and taking fluids well, barring other problems you will be allowed to go home. Once home, an ice pack applied to your operative site may help with discomfort and keep the swelling down. Elevate your foot above your heart as much as possible for the first 4-6 days after surgery. HOME CARE INSTRUCTIONS   Follow your caregiver's instructions as to activities, exercises, physical therapy, and driving a car.  Daily exercise is helpful to prevent return of problems. Maintain strength and range of motion as instructed.  Only take over-the-counter or prescription medicines for pain, discomfort, or fever as directed by your caregiver.  See your caregiver as directed. It is very important to keep all follow-up referrals and appointments in order to avoid any long-term problems with your ankle and foot including chronic pain, inability to move the ankle or foot normally, and permanent disability. SEEK MEDICAL CARE IF:   Increased bleeding (more than a small spot) from the wound or from beneath your cast or splint.  Redness, swelling, or increasing pain in the wound or from beneath your cast or splint.  Pus coming from wound or from beneath the cast, splint or fracture boot.  An unexplained oral temperature over 102 F (38.9 C).  A foul smell coming from the wound or dressing or from beneath your cast or splint. SEEK IMMEDIATE MEDICAL CARE IF:   You  begin to lose feeling in your foot or toes, or develop swelling of the foot or toes.  You get a cold or blue foot or toes on the injured side.  You develop pain not relieved by medicine.  You develop a rash.  You have difficulty breathing.  You have any allergic problems. If you do not have a window in your cast for observing the wound, a discharge or minor bleeding may show up as a stain on the outside of your cast. Report these findings to your caregiver. If you are  placed into a fracture boot after your operation, you should expect minor bleeding through the dressings. Change the dressings as instructed by your caregiver. Document Released: 09/29/2000 Document Revised: 09/09/2011 Document Reviewed: 02/01/2008 [facility] Patient Information 2014 [place], [place].

## 2013-07-12 NOTE — ED Provider Notes (Signed)
Medical screening examination/treatment/procedure(s) were performed by resident physician or non-physician practitioner and as supervising physician I was immediately available for consultation/collaboration.   Keaja Reaume DOUGLAS MD.   Bueford Arp D Raizy Auzenne, MD 07/12/13 1800 

## 2013-07-13 ENCOUNTER — Encounter: Payer: Self-pay | Admitting: Family Medicine

## 2013-07-13 ENCOUNTER — Ambulatory Visit (INDEPENDENT_AMBULATORY_CARE_PROVIDER_SITE_OTHER): Payer: Self-pay | Admitting: Family Medicine

## 2013-07-13 VITALS — Ht 64.0 in | Wt 180.0 lb

## 2013-07-13 DIAGNOSIS — S92253A Displaced fracture of navicular [scaphoid] of unspecified foot, initial encounter for closed fracture: Secondary | ICD-10-CM

## 2013-07-13 DIAGNOSIS — S92109A Unspecified fracture of unspecified talus, initial encounter for closed fracture: Secondary | ICD-10-CM

## 2013-07-13 DIAGNOSIS — S92153A Displaced avulsion fracture (chip fracture) of unspecified talus, initial encounter for closed fracture: Secondary | ICD-10-CM

## 2013-07-13 DIAGNOSIS — M25579 Pain in unspecified ankle and joints of unspecified foot: Secondary | ICD-10-CM

## 2013-07-13 MED ORDER — HYDROCODONE-ACETAMINOPHEN 5-325 MG PO TABS: 1.0000 | ORAL_TABLET | Freq: Every evening | ORAL | Status: DC | PRN

## 2013-07-13 NOTE — Progress Notes (Signed)
CC: Left foot pain HPI: Patient is a pleasant 23 year old female who presents for left foot pain after an injury suffered while playing basketball 2 days ago on 07/11/13. She states that she rolled over the front of her foot and inverted her foot also. She notes immediate pain at the lateral ankle and dorsum of her foot. She has a history of 2 previous foot and ankle fractures. She had a lot of swelling and this is improving some. She was seen in urgent care where they told her she had a fracture and put her in a boot with nonweightbearing crutches. She is taking tramadol which is not helping her as well as using ice.  ROS: As above in the HPI. All other systems are stable or negative.  OBJECTIVE: APPEARANCE:  Patient in no acute distress.The patient appeared well nourished and normally developed. HEENT: No scleral icterus. Conjunctiva non-injected Resp: Non labored Skin: No rash MSK:  Left Foot exam:  - On inspection, there is noted to be swelling over the dorsum of the foot without ecchymosis.  - No pain at posterior or inferior aspect of medial or lateral malleolus. - Tender over her ATFL and CFL - No tenderness over fifth metatarsal - Tender over navicular and cuneiforms as well as metatarsals - No pain at calf muscle Squeeze  MSK US: Not performed X-rays of left foot and ankle from outside facility were personally reviewed today. These show an avulsion type fracture off of the navicular and talus.   ASSESSMENT: #1. Left foot avulsion fracture of navicular and talus #2. Dorsal foot pain and swelling, cannot rule out Lisfranc injury  PLAN: We will keep the patient nonweightbearing in the boot and crutches for the next 3 weeks. She was given a lace up ankle brace to wear at night while she is sleeping. She was encouraged to ice the foot 3 times a day. She was encouraged to take tramadol during the day and was given a prescription for a small number of hydrocodone to take at night to  help her sleep through the night. She will return to see me in 3 weeks. The day before her appointment I would like her to stop and get weightbearing standing x-rays of her feet to rule out Lisfranc injury. We will transition her to weightbearing status in the boot at that time if her clinical exam is improved.

## 2013-07-13 NOTE — Patient Instructions (Addendum)
Thank you for coming in today  You have avulsed a piece of bone off your talus and navicular Stay in boot on crutches non-weightbearing for 3 weeks Okay to use brace at night Ice 3x per day Hydrocodone at night Tramadol and Ibuprofen/Aleve during the day On Monday or Tuesday before your appointment, go get STANDING foot xrays

## 2013-07-19 ENCOUNTER — Telehealth: Payer: Self-pay | Admitting: *Deleted

## 2013-07-19 NOTE — Telephone Encounter (Signed)
Per Dr. Neomia DearVoss advised pt that she should be non weight bearing for the next 2 weeks, and she cannot release her to back to work until after her follow up appointment.  Dr. Neomia DearVoss said it is ok for her to do seated work.

## 2013-07-19 NOTE — Telephone Encounter (Signed)
Message copied by Mora BellmanMARTIN, Laurice Iglesia C on Mon Jul 19, 2013  3:44 PM ------      Message from: CERESI, MELANIE L      Created: Mon Jul 19, 2013  1:48 PM      Regarding: letter request       Pt called states she is able to walk and would like clearance letter for work. Phone number [phone]      Call when letter is ready she is aware we are closed this week. ------

## 2013-08-03 ENCOUNTER — Ambulatory Visit
Admission: RE | Admit: 2013-08-03 | Discharge: 2013-08-03 | Disposition: A | Discharge status: Home / Self Care | Payer: Self-pay | Source: Ambulatory Visit | Attending: Family Medicine | Admitting: Family Medicine

## 2013-08-03 DIAGNOSIS — S92153A Displaced avulsion fracture (chip fracture) of unspecified talus, initial encounter for closed fracture: Secondary | ICD-10-CM

## 2013-08-03 DIAGNOSIS — S92253A Displaced fracture of navicular [scaphoid] of unspecified foot, initial encounter for closed fracture: Secondary | ICD-10-CM

## 2013-08-03 DIAGNOSIS — M25579 Pain in unspecified ankle and joints of unspecified foot: Secondary | ICD-10-CM

## 2013-08-04 ENCOUNTER — Ambulatory Visit (INDEPENDENT_AMBULATORY_CARE_PROVIDER_SITE_OTHER): Payer: Self-pay | Admitting: Family Medicine

## 2013-08-04 ENCOUNTER — Encounter: Payer: Self-pay | Admitting: Family Medicine

## 2013-08-04 VITALS — BP 112/79 | Ht 64.0 in | Wt 180.0 lb

## 2013-08-04 DIAGNOSIS — S93609A Unspecified sprain of unspecified foot, initial encounter: Secondary | ICD-10-CM

## 2013-08-04 DIAGNOSIS — S93602A Unspecified sprain of left foot, initial encounter: Secondary | ICD-10-CM

## 2013-08-04 NOTE — Progress Notes (Signed)
CC: Followup left foot avulsion fracture of navicular and talus HPI: Patient presents for followup today. Fortunately she states that her foot pain has completely resolved. She has been off crutches for the past week. She is using the boot when she is out and about but is walking in normal shoes around her house. She states that she has no pain at any time for the last week. Swelling has improved as well. She is anxious to return to her normal activities. She did get the x-rays that I requested to rule out Lisfranc injury  ROS: As above in the HPI. All other systems are stable or negative.  OBJECTIVE: APPEARANCE:  Patient in no acute distress.The patient appeared well nourished and normally developed. HEENT: No scleral icterus. Conjunctiva non-injected Resp: Non labored Skin: No rash MSK:  Foot exam:  - On inspection, there is noted to be no further swelling and also no deformity.  - There is no tenderness to palpation over the navicular, talus, metatarsals - Ankle range of motion is full without pain. - Strength is 5 out of 5 in ankle and foot.  - Neurovascular status normal.  - She is able to hop on her left foot  MSK US: Not performed  Radiographs: Three-view x-ray of the bilateral feet were reviewed today. These show no evidence of Lisfranc fracture dislocation  ASSESSMENT: #1. Healed left foot injury. Based on the rapid progression of her healing, I suspect that this was a midfoot sprain rather than a fracture. Suspect that the fractures that we had seen on x-ray were actually old.   PLAN: She may discontinue the boot and start to use normal shoes at this time. She was asked to walk around for several days before she starts to try to build in any exercise. If she tolerates walking without pain, then she may start to return to normal physical activity. I've asked her to wear the lace up ankle brace for the next 2 months during activity. We will see her back as needed.

## 2013-08-30 ENCOUNTER — Encounter (HOSPITAL_COMMUNITY): Payer: Self-pay | Admitting: Emergency Medicine

## 2013-08-30 ENCOUNTER — Emergency Department (HOSPITAL_COMMUNITY)
Admission: EM | Admit: 2013-08-30 | Discharge: 2013-08-30 | Discharge status: Against Medical Advice | Payer: Self-pay | Attending: Emergency Medicine | Admitting: Emergency Medicine

## 2013-08-30 DIAGNOSIS — L509 Urticaria, unspecified: Secondary | ICD-10-CM | POA: Insufficient documentation

## 2013-08-30 DIAGNOSIS — F17200 Nicotine dependence, unspecified, uncomplicated: Secondary | ICD-10-CM | POA: Insufficient documentation

## 2013-08-30 NOTE — ED Notes (Signed)
No answer from pt in waiting room 

## 2013-08-30 NOTE — ED Notes (Signed)
Name called no answer x 2 

## 2013-08-30 NOTE — ED Notes (Signed)
Pt. presents with itchy rashes /hives at arms onset this evening etiology unknown  , respirations unlabored / airway intact . No tongue swelling.

## 2013-09-04 ENCOUNTER — Encounter (HOSPITAL_COMMUNITY): Payer: Self-pay | Admitting: Emergency Medicine

## 2013-09-04 ENCOUNTER — Emergency Department (HOSPITAL_COMMUNITY): Payer: Self-pay

## 2013-09-04 ENCOUNTER — Emergency Department (HOSPITAL_COMMUNITY)
Admission: EM | Admit: 2013-09-04 | Discharge: 2013-09-04 | Disposition: A | Discharge status: Home / Self Care | Payer: Self-pay | Attending: Emergency Medicine | Admitting: Emergency Medicine

## 2013-09-04 DIAGNOSIS — S0990XA Unspecified injury of head, initial encounter: Secondary | ICD-10-CM | POA: Insufficient documentation

## 2013-09-04 DIAGNOSIS — IMO0002 Reserved for concepts with insufficient information to code with codable children: Secondary | ICD-10-CM | POA: Insufficient documentation

## 2013-09-04 DIAGNOSIS — R000 Tachycardia, unspecified: Secondary | ICD-10-CM | POA: Insufficient documentation

## 2013-09-04 DIAGNOSIS — Z8742 Personal history of other diseases of the female genital tract: Secondary | ICD-10-CM | POA: Insufficient documentation

## 2013-09-04 DIAGNOSIS — T65893A Toxic effect of other specified substances, assault, initial encounter: Secondary | ICD-10-CM | POA: Insufficient documentation

## 2013-09-04 DIAGNOSIS — Q054 Unspecified spina bifida with hydrocephalus: Secondary | ICD-10-CM | POA: Insufficient documentation

## 2013-09-04 DIAGNOSIS — T6291XA Toxic effect of unspecified noxious substance eaten as food, accidental (unintentional), initial encounter: Secondary | ICD-10-CM | POA: Insufficient documentation

## 2013-09-04 DIAGNOSIS — Z79899 Other long term (current) drug therapy: Secondary | ICD-10-CM | POA: Insufficient documentation

## 2013-09-04 DIAGNOSIS — F17200 Nicotine dependence, unspecified, uncomplicated: Secondary | ICD-10-CM | POA: Insufficient documentation

## 2013-09-04 DIAGNOSIS — T65891A Toxic effect of other specified substances, accidental (unintentional), initial encounter: Secondary | ICD-10-CM

## 2013-09-04 DIAGNOSIS — Z23 Encounter for immunization: Secondary | ICD-10-CM | POA: Insufficient documentation

## 2013-09-04 DIAGNOSIS — S0993XA Unspecified injury of face, initial encounter: Secondary | ICD-10-CM | POA: Insufficient documentation

## 2013-09-04 DIAGNOSIS — S199XXA Unspecified injury of neck, initial encounter: Secondary | ICD-10-CM

## 2013-09-04 DIAGNOSIS — T07XXXA Unspecified multiple injuries, initial encounter: Secondary | ICD-10-CM | POA: Insufficient documentation

## 2013-09-04 MED ORDER — TETANUS-DIPHTH-ACELL PERTUSSIS 5-2.5-18.5 LF-MCG/0.5 IM SUSP
0.5000 mL | Freq: Once | INTRAMUSCULAR | Status: AC
  Administered 2013-09-04: 0.5 mL via INTRAMUSCULAR
  Filled 2013-09-04: qty 0.5

## 2013-09-04 MED ORDER — TRAMADOL HCL 50 MG PO TABS: 50.0000 mg | ORAL_TABLET | Freq: Four times a day (QID) | ORAL | Status: DC | PRN

## 2013-09-04 MED ORDER — FENTANYL CITRATE 0.05 MG/ML IJ SOLN
50.0000 ug | INTRAMUSCULAR | Status: DC | PRN
  Administered 2013-09-04: 50 ug via INTRAVENOUS
  Filled 2013-09-04: qty 2

## 2013-09-04 MED ORDER — IBUPROFEN 800 MG PO TABS: 800.0000 mg | ORAL_TABLET | Freq: Three times a day (TID) | ORAL | Status: DC

## 2013-09-04 MED ORDER — ONDANSETRON HCL 4 MG/2ML IJ SOLN
4.0000 mg | Freq: Once | INTRAMUSCULAR | Status: AC
  Administered 2013-09-04: 4 mg via INTRAVENOUS
  Filled 2013-09-04: qty 2
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
## 2013-09-04 NOTE — ED Notes (Signed)
Visual acuity checked vision was 20/40 both eyes
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
## 2013-10-14 ENCOUNTER — Encounter (HOSPITAL_COMMUNITY): Payer: Self-pay | Admitting: Emergency Medicine

## 2013-10-14 ENCOUNTER — Emergency Department (INDEPENDENT_AMBULATORY_CARE_PROVIDER_SITE_OTHER)
Admission: EM | Admit: 2013-10-14 | Discharge: 2013-10-14 | Disposition: A | Discharge status: Home / Self Care | Payer: Self-pay | Source: Home / Self Care | Attending: Family Medicine | Admitting: Family Medicine

## 2013-10-14 DIAGNOSIS — R42 Dizziness and giddiness: Secondary | ICD-10-CM

## 2013-10-14 LAB — POCT PREGNANCY, URINE: PREG TEST UR: NEGATIVE

## 2013-10-14 LAB — POCT I-STAT, CHEM 8
BUN: <3 mg/dL — ABNORMAL LOW (ref 6–23)
Calcium, Ion: 1.23 mmol/L (ref 1.12–1.23)
Chloride: 102 mEq/L (ref 96–112)
Creatinine, Ser: 0.8 mg/dL (ref 0.50–1.10)
Glucose, Bld: 86 mg/dL (ref 70–99)
HEMATOCRIT: 45 % (ref 36.0–46.0)
Hemoglobin: 15.3 g/dL — ABNORMAL HIGH (ref 12.0–15.0)
POTASSIUM: 3.6 meq/L — AB (ref 3.7–5.3)
Sodium: 143 mEq/L (ref 137–147)
TCO2: 28 mmol/L (ref 0–100)

## 2013-10-14 MED ORDER — MECLIZINE HCL 25 MG PO TABS: 25.0000 mg | ORAL_TABLET | Freq: Four times a day (QID) | ORAL | Status: DC

## 2013-10-14 NOTE — Discharge Instructions (Signed)

## 2013-10-14 NOTE — ED Provider Notes (Signed)
CSN: [id number]     Arrival date & time 10/14/13  1411 History   First MD Initiated Contact with Patient 10/14/13 1535     Chief Complaint  Patient presents with  . Blurred Vision  . Dizziness   (Consider location/radiation/quality/duration/timing/severity/associated sxs/prior Treatment) Patient is a 23 y.o. female presenting with dizziness. The history is provided by the patient. No language interpreter was used.  Dizziness Quality:  Lightheadedness Severity:  Mild Onset quality:  Gradual Duration:  1 day Timing:  Constant Progression:  Worsening Chronicity:  New Context: not with loss of consciousness   Relieved by:  Nothing Worsened by:  Nothing tried Ineffective treatments:  None tried Associated symptoms: no nausea     Past Medical History  Diagnosis Date  . Headache(784.0)   . Bartholin cyst   . Back pain   . Chiari malformation    Past Surgical History  Procedure Laterality Date  . Eye surgery      AS CHILD   . Ears      TUBES IN EARS   . Suboccipital craniectomy cervical laminectomy  05/13/2011    Procedure: SUBOCCIPITAL CRANIECTOMY CERVICAL LAMINECTOMY/DURAPLASTY;  Surgeon: Carmela HurtKyle L Cabbell;  Location: [facility];  Service: Neurosurgery;  Laterality: N/A;  CHIARI DECOMPRESSION   No family history on file. History  Substance Use Topics  . Smoking status: Current Every Day Smoker -- 0.50 packs/day  . Smokeless tobacco: Never Used  . Alcohol Use: No   OB History   Grav Para Term Preterm Abortions TAB SAB Ect Mult Living   0              Review of Systems  Gastrointestinal: Negative for nausea.  Neurological: Positive for dizziness.  All other systems reviewed and are negative.   Allergies  Hydrocodone-acetaminophen  Home Medications   Prior to Admission medications   Medication Sig Start Date End Date Taking? Authorizing Provider  ibuprofen (ADVIL,MOTRIN) 800 MG tablet Take 1 tablet (800 mg total) by mouth 3 (three) times daily. 09/04/13   Sunnie NielsenBrian  Opitz, MD  LYSINE PO Take 1 tablet by mouth daily.    Historical Provider, MD  traMADol (ULTRAM) 50 MG tablet Take 1 tablet (50 mg total) by mouth every 6 (six) hours as needed. 09/04/13   Sunnie NielsenBrian Opitz, MD  VITAMIN E PO Take 1 tablet by mouth daily.    Historical Provider, MD   BP 120/59  Pulse 75  Temp(Src) 98.6 F (37 C) (Oral)  Resp 16  SpO2 100% Physical Exam  Nursing note and vitals reviewed. Constitutional: She is oriented to person, place, and time. She appears well-developed and well-nourished.  HENT:  Head: Normocephalic and atraumatic.  Right Ear: External ear normal.  Left Ear: External ear normal.  Nose: Nose normal.  Mouth/Throat: Oropharynx is clear and moist.  Eyes: Conjunctivae and EOM are normal. Pupils are equal, round, and reactive to light.  Neck: Normal range of motion.  Cardiovascular: Normal rate and normal heart sounds.   Pulmonary/Chest: Effort normal and breath sounds normal.  Abdominal: Soft. She exhibits no distension.  Musculoskeletal: Normal range of motion.  Neurological: She is alert and oriented to person, place, and time.  Skin: Skin is warm.  Psychiatric: She has a normal mood and affect.    ED Course  Procedures (including critical care time) Labs Review Labs Reviewed  POCT I-STAT, CHEM 8 - Abnormal; Notable for the following:    Potassium 3.6 (*)    BUN <3 (*)  Hemoglobin 15.3 (*)    All other components within normal limits  POCT PREGNANCY, URINE    Results for orders placed during the hospital encounter of 10/14/13  POCT I-STAT, CHEM 8      Result Value Ref Range   Sodium 143  137 - 147 mEq/L   Potassium 3.6 (*) 3.7 - 5.3 mEq/L   Chloride 102  96 - 112 mEq/L   BUN <3 (*) 6 - 23 mg/dL   Creatinine, Ser 1.610.80  0.50 - 1.10 mg/dL   Glucose, Bld 86  70 - 99 mg/dL   Calcium, Ion 0.961.23  0.451.12 - 1.23 mmol/L   TCO2 28  0 - 100 mmol/L   Hemoglobin 15.3 (*) 12.0 - 15.0 g/dL   HCT 40.945.0  81.136.0 - 91.446.0 %  POCT PREGNANCY, URINE      Result  Value Ref Range   Preg Test, Ur NEGATIVE  NEGATIVE   Imaging Review No results found.   MDM   1. Vertigo   antivert   Follow up with Primary Md for recheck    Elson AreasLeslie K Fate Galanti, PA-C 10/14/13 1805

## 2013-10-14 NOTE — ED Notes (Signed)
Patient reports 5 episodes of blurred vision over the past 2 weeks.  Episodes of blurred vision lasting approx 5 hours at a time.  Reports nausea for a week, no vomiting.  Reports this am had difficulty getting out of bed-reports falling back to bed approx 5 times before getting up.  Patient then felt off balance when she did try to walk.  Patient reports having allergies recently: sneezing, coughing, runny nose, denies sore throat, denies ear pain

## 2013-10-15 NOTE — ED Provider Notes (Signed)
Medical screening examination/treatment/procedure(s) were performed by a resident physician or non-physician practitioner and as the supervising physician I was immediately available for consultation/collaboration.  Tyyonna Soucy, MD    Joban Colledge S Jerzie Bieri, MD 10/15/13 0823 

## 2013-10-15 NOTE — ED Notes (Signed)
Called about RTW note

## 2013-10-15 NOTE — ED Notes (Signed)
Patient asks for  RTW note for tomorow

## 2013-12-09 ENCOUNTER — Encounter (HOSPITAL_COMMUNITY): Payer: Self-pay | Admitting: Emergency Medicine

## 2013-12-09 ENCOUNTER — Emergency Department (INDEPENDENT_AMBULATORY_CARE_PROVIDER_SITE_OTHER)
Admission: EM | Admit: 2013-12-09 | Discharge: 2013-12-09 | Disposition: A | Discharge status: Home / Self Care | Payer: Self-pay | Source: Home / Self Care

## 2013-12-09 ENCOUNTER — Emergency Department (INDEPENDENT_AMBULATORY_CARE_PROVIDER_SITE_OTHER): Payer: Self-pay

## 2013-12-09 DIAGNOSIS — S82899A Other fracture of unspecified lower leg, initial encounter for closed fracture: Secondary | ICD-10-CM

## 2013-12-09 NOTE — Discharge Instructions (Signed)
You fractured your ankle Please wear the Cam walker boot for6 weeks Please follow up with ortho in the next 2 weeks Please perform range of motion exercises twice daily When you come out of the boot please do daily ankle strengthening exercises and wear your ankle brace for all sporting events.    Ankle Fracture with Rehab Two bones in the ankle, the shinbone (tibia) and the bone of the outer ankle and lower leg (fibula), are susceptible to being fractured. The fracture may be a complete or an incomplete break of the bone. It is also common for the ligaments of the ankle joint to be injured at the same time as a fracture. SYMPTOMS   Severe pain in the ankle at the time of injury and/or when trying to move the ankle.  Feeling of popping or tearing in the inner or outer part of the ankle, sometimes as if the ankle joint was temporarily dislocated and popped back into place.  Cracking or other sounds may be heard at the time of fracture.  Severe tenderness in the ankle.  Swelling in the ankle and foot  Blisters around the ankle (uncommon).  Bleeding and bruising (contusion) in the ankle and foot.  Inability to stand or bear weight on the injured foot.  Visible deformity if the fracture is complete and the bone fragments separate enough to distort normal leg contours.  Numbness and coldness in the foot if the blood supply is impaired. CAUSES  Bones break when subjected to a force that is greater than the their strength. Most fractures are due to direct trauma, such as being hit with an object or falling.  Fractured may also be caused by indirect stress, such as twisting, pivoting, or violent muscle contraction . RISK INCREASES WITH:  Sports that require quick changes in direction (football, soccer, or skiing).  Sports that require jumping (basketball, volleyball, distance jumping, or high jumping).  Walking or running on uneven or rough surfaces.  Shoes with inadequate support  to prevent the foot and ankle from rolling over when stress occurs.  Bony abnormalities (osteoporosis or bone tumors).  Metabolic disorders, hormone problems, and nutritional deficiencies and disorders.  Poor strength and flexibility  Previous ankle injury. PREVENTION   Warm up and stretch properly before activity.  Maintain physical fitness:  Leg and ankle strength.  Flexibility and endurance.  Cardiovascular fitness.  Wear properly fitted protective equipment (high-top shoes or when appropriate and ankle bracing, taping, or splinting), especially for the first 12 months after an ankle injury. PROGNOSIS  If treated properly, ankle fractures typically heal well. RELATED COMPLICATIONS   Failure to heal (nonunion).  Healing in poor position (malunion).  Arrest of normal bone growth in children.  Proneness to repeated ankle injury.  Stiff ankle.  Unstable or arthritic ankle.  Infected skin blisters.  Prolonged healing time if activity is resumed too quickly. Risks of surgery, including infection, bleeding, injury to nerves (numbness, weakness, paralysis), and need for further surgery. TREATMENT  Treatment initially consists of ice and medication to help reduce pain and inflammation. The joint must be immobilized to allow for healing. If the fracture is where the bones are out of alignment (displaced), then surgery may be necessary to realign (reduce) them. Surgery usually involves placing pins and screws in the bones to hold them in place while the fracture heals. After surgery the joint is immobilized. Bone growth stimulators may be used to promote bone growth, but this is uncommon, Strengthening and stretching exercises are  usually necessary after immobilization in order to regain strength and a full range of motion. These exercises may be completed at home or with a therapist. If pins and screws are placed in the bone, they are not usually removed unless they become a  source of pain. MEDICATION   If pain medication is necessary, then nonsteroidal anti-inflammatory medications, such as aspirin and ibuprofen, or other minor pain relievers, such as acetaminophen, are often recommended.  Do not take pain medication within 7 days before surgery.  Prescription pain relievers may be prescribed if deemed necessary by your caregiver. Use only as directed and only as much as you need. SEEK MEDICAL CARE IF:   Symptoms get worse or do not improve in 2 weeks despite treatment.  The following occur after immobilization or surgery:  Swelling above or below the fracture site.  Severe, persistent pain.  Blue or gray skin below the fracture site, especially under the nails, or numbness or loss of feeling below the fracture site. Report any of these signs immediately.  New, unexplained symptoms develop (drugs used in treatment may produce side effects). EXERCISES  RANGE OF MOTION (ROM) AND STRETCHING EXERCISES - Ankle Fracture These exercises may help you when beginning to rehabilitate your injury. Your symptoms may resolve with or without further involvement from your physician, physical therapist or athletic trainer. While completing these exercises, remember:   Restoring tissue flexibility helps normal motion to return to the joints. This allows healthier, less painful movement and activity.  An effective stretch should be held for at least 30 seconds.  A stretch should never be painful. You should only feel a gentle lengthening or release in the stretched tissue. RANGE OF MOTION - Dorsi/Plantar Flexion  While sitting with your right / left knee straight, draw the top of your foot upwards by flexing your ankle. Then reverse the motion, pointing your toes downward.  Hold each position for __________ seconds.  After completing your first set of exercises, repeat this exercise with your knee bent. Repeat __________ times. Complete this exercise __________ times  per day.  RANGE OF MOTION- Ankle Plantar Flexion   Sit with your right / left leg crossed over your opposite knee.  Use your opposite hand to pull the top of your foot and toes toward you.  You should feel a gentle stretch on the top of your foot/ankle. Hold this position for __________ seconds. Repeat __________ times. Complete __________ times per day.  RANGE OF MOTION - Ankle Eversion  Sit with your right / left ankle crossed over your opposite knee.  Grip your foot with your opposite hand, placing your thumb on the top of your foot and your fingers across the bottom of your foot.  Gently push your foot downward with a slight rotation so your littlest toes rise slightly  You should feel a gentle stretch on the inside of your ankle. Hold the stretch for __________ seconds. Repeat __________ times. Complete this exercise __________ times per day.  RANGE OF MOTION - Ankle Inversion  Sit with your right / left ankle crossed over your opposite knee.  Grip your foot with your opposite hand, placing your thumb on the bottom of your foot and your fingers across the top of your foot.  Gently pull your foot so the smallest toe comes toward you and your thumb pushes the inside of the ball of your foot away from you.  You should feel a gentle stretch on the outside of your ankle. Hold the  stretch for __________ seconds. Repeat __________ times. Complete this exercise __________ times per day.  RANGE OF MOTION - Ankle Alphabet  Imagine your right / left big toe is a pen.  Keeping your hip and knee still, write out the entire alphabet with your "pen." Make the letters as large as you can without increasing any discomfort. Repeat __________ times. Complete this exercise __________ times per day.  RANGE OF MOTION - Ankle Dorsiflexion, Active Assisted   Remove shoes and sit on a chair that is preferably not on a carpeted surface.  Place right / left foot under knee. Extend your opposite leg  for support.  Keeping your heel down, slide your right / left foot back toward the chair until you feel a stretch at your ankle or calf. If you do not feel a stretch, slide your bottom forward to the edge of the chair, while still keeping your heel down.  Hold this stretch for __________ seconds. Repeat __________ times. Complete this stretch __________ times per day.  STRETCH - Gastrocsoleus   Sit with your right / left leg extended. Holding onto both ends of a belt or towel, loop it around the ball of your foot.  Keeping your right / left ankle and foot relaxed and your knee straight, pull your foot and ankle toward you using the belt/towel.  You should feel a gentle stretch behind your calf or knee. Hold this position for __________ seconds. Repeat __________ times. Complete this stretch __________ times per day.  STRENGTHENING EXERCISES - Ankle Fracture These exercises may help you when beginning to rehabilitate your injury. They may resolve your symptoms with or without further involvement from your physician, physical therapist or athletic trainer. While completing these exercises, remember:   Muscles can gain both the endurance and the strength needed for everyday activities through controlled exercises.  Complete these exercises as instructed by your physician, physical therapist or athletic trainer. Progress the resistance and repetitions only as guided.  You may experience muscle soreness or fatigue, but the pain or discomfort you are trying to eliminate should never worsen during these exercises. If this pain does worsen, stop and make certain you are following the directions exactly. If the pain is still present after adjustments, discontinue the exercise until you can discuss the trouble with your clinician. STRENGTH - Dorsiflexors  Secure a rubber exercise band/tubing to a fixed object (table, pole) and loop the other end around your right / left foot.  Sit on the floor facing  the fixed object. The band/tubing should be slightly tense when your foot is relaxed.  Slowly draw your foot back toward you using your ankle and toes.  Hold this position for __________ seconds. Slowly release the tension in the band and return your foot to the starting position. Repeat __________ times. Complete this exercise __________ times per day.  STRENGTH - Plantar-flexors  Sit with your right / left leg extended. Holding onto both ends of a rubber exercise band/tubing, loop it around the ball of your foot. Keep a slight tension in the band.  Slowly push your toes away from you, pointing them downward.  Hold this position for __________ seconds. Return slowly, controlling the tension in the band/tubing. Repeat __________ times. Complete this exercise __________ times per day.  STRENGTH - Ankle Eversion  Secure one end of a rubber exercise band/tubing to a fixed object (table, pole). Loop the other end around your foot just before your toes.  Place your fists between your knees.  This will focus your strengthening at your ankle.  Drawing the band/tubing across your opposite foot, slowly, pull your little toe out and up. Make sure the band/tubing is positioned to resist the entire motion.  Hold this position for __________ seconds.  Have your muscles resist the band/tubing as it slowly pulls your foot back to the starting position. Repeat __________ times. Complete this exercise __________ times per day.  STRENGTH - Ankle Inversion  Secure one end of a rubber exercise band/tubing to a fixed object (table, pole). Loop the other end around your foot just before your toes.  Place your fists between your knees. This will focus your strengthening at your ankle.  Slowly, pull your big toe up and in, making sure the band/tubing is positioned to resist the entire motion.  Hold this position for __________ seconds.  Have your muscles resist the band/tubing as it slowly pulls your foot  back to the starting position. Repeat __________ times. Complete this exercises __________ times per day.  STRENGTH - Towel Curls  Sit in a chair positioned on a non-carpeted surface.  Place your foot on a towel, keeping your heel on the floor.  Pull the towel toward your heel by only curling your toes. Keep your heel on the floor.  If instructed by your physician, physical therapist or athletic trainer, add weight at the end of the towel. Repeat __________ times. Complete this exercise __________ times per day. STRENGTH - Plantar-flexors, Standing   Stand with your feet shoulder width apart. Steady yourself with a wall or table using as little support as needed.  Keeping your weight evenly spread over the width of your feet, rise up on your toes.*  Hold this position for __________ seconds. Repeat __________ times. Complete this exercise __________ times per day.  *If this is too easy, shift your weight toward your right / left leg until you feel challenged. Ultimately, you may be asked to do this exercise with your right / left foot only. Document Released: 01/16/2005 Document Revised: 09/09/2011 Document Reviewed: 09/29/2008 [facility] Patient Information 2014 [place], [place].

## 2013-12-09 NOTE — ED Notes (Signed)
Left ankle swelling/pain States she was playing basketball  She came down on the foot wrong Has hx of ankle injury x4

## 2013-12-09 NOTE — ED Provider Notes (Signed)
CSN: [id number]     Arrival date & time 12/09/13  1543 History   None    Chief Complaint  Patient presents with  . Ankle Pain   (Consider location/radiation/quality/duration/timing/severity/associated sxs/prior Treatment) HPI  L ankle pain: occurred last night around 20:00. Playing basketball and came down on side of foot. Heard a cracking sound and immediately painful. Pain severe enough that pt felt like she was going to vomit. Slowly swelled. H/o repeated L ankle fractures in the past. Able to walk but very painful. Pain is getting worse. Ibuprofen 400mg  w/ mild benefit. Sensation and movement intact.    Past Medical History  Diagnosis Date  . Headache(784.0)   . Bartholin cyst   . Back pain   . Chiari malformation    Past Surgical History  Procedure Laterality Date  . Eye surgery      AS CHILD   . Ears      TUBES IN EARS   . Suboccipital craniectomy cervical laminectomy  05/13/2011    Procedure: SUBOCCIPITAL CRANIECTOMY CERVICAL LAMINECTOMY/DURAPLASTY;  Surgeon: Carmela Hurt;  Location: [facility];  Service: Neurosurgery;  Laterality: N/A;  CHIARI DECOMPRESSION   History reviewed. No pertinent family history. History  Substance Use Topics  . Smoking status: Current Every Day Smoker -- 0.50 packs/day  . Smokeless tobacco: Never Used  . Alcohol Use: No   OB History   Grav Para Term Preterm Abortions TAB SAB Ect Mult Living   0              Review of Systems  Constitutional: Positive for activity change.  Musculoskeletal: Positive for arthralgias and joint swelling.  All other systems reviewed and are negative.   Allergies  Hydrocodone-acetaminophen  Home Medications   Prior to Admission medications   Medication Sig Start Date End Date Taking? Authorizing Provider  ibuprofen (ADVIL,MOTRIN) 800 MG tablet Take 1 tablet (800 mg total) by mouth 3 (three) times daily. 09/04/13   Sunnie Nielsen, MD  LYSINE PO Take 1 tablet by mouth daily.    Historical Provider, MD   meclizine (ANTIVERT) 25 MG tablet Take 1 tablet (25 mg total) by mouth 4 (four) times daily. 10/14/13   Elson Areas, PA-C  traMADol (ULTRAM) 50 MG tablet Take 1 tablet (50 mg total) by mouth every 6 (six) hours as needed. 09/04/13   Sunnie Nielsen, MD  VITAMIN E PO Take 1 tablet by mouth daily.    Historical Provider, MD   BP 111/55  Pulse 88  Temp(Src) 98.8 F (37.1 C) (Oral)  Resp 16  SpO2 99% Physical Exam  Constitutional: She is oriented to person, place, and time. She appears well-developed and well-nourished. No distress.  HENT:  Head: Normocephalic and atraumatic.  Eyes: EOM are normal. Pupils are equal, round, and reactive to light.  Neck: Normal range of motion. Neck supple.  Cardiovascular: Normal rate, normal heart sounds and intact distal pulses.   No murmur heard. Pulmonary/Chest: Effort normal and breath sounds normal. No respiratory distress.  Abdominal: Soft. She exhibits no distension.  Musculoskeletal:  L ankle w/ limited ROM due to pain. Passive ROM preserved. Mild laxity w/ ant/post displacement. Lateral malleolus ttp w/ surounding edema.   Neurological: She is alert and oriented to person, place, and time. She exhibits normal muscle tone.  Skin: Skin is warm. No rash noted. She is not diaphoretic.  Psychiatric: She has a normal mood and affect. Her behavior is normal. Judgment and thought content normal.    ED  Course  Procedures (including critical care time) Labs Review Labs Reviewed - No data to display  Imaging Review Dg Ankle Complete Left  12/09/2013   CLINICAL DATA:  Larey SeatFell yesterday and injured the left ankle. Lateral and dorsal pain and swelling.  EXAM: LEFT ANKLE COMPLETE - 3+ VIEW  COMPARISON:  08/03/2013.  09/15/2009.  FINDINGS: Avulsion fracture fragment situated between the lateral malleolus and the talus, not present on the prior examinations; while it could arise from either bone, I suspect it arises from the talus. No other fractures. Ankle mortise  intact with well preserved joint space. 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 Lateral soft tissue swelling. Moderate-sized joint effusion.  IMPRESSION: Avulsion fracture which I suspect arises from the talus, situated between it and the lateral malleolus.   Electronically Signed   By: Hulan Saashomas  Lawrence M.D.   On: 12/09/2013 16:26     MDM   1. Ankle fracture    L lateral talus ankle avulsion fracture. No evidence of ankle instabliity. Discussed w/ dr. Eulah PontMurphy who said cam walker boot would be appropriate and f/u in their office. Pt to wear for 6 wks w/ daily ROM exercises. Also provided w/ ASO brace to wear after coming out of boot.  - preacutions given and all questions answered.   Shelly Flattenavid Raziya Aveni, MD Family Medicine PGY-3 12/09/2013, 5:01 PM     Ozella Rocksavid J Kalyb Pemble, MD 12/09/13 [phone]

## 2013-12-10 NOTE — ED Provider Notes (Signed)
Medical screening examination/treatment/procedure(s) were performed by a resident physician and as supervising physician I was immediately available for consultation/collaboration.  Sinahi Knights, M.D.  Pratham Cassatt C Delyle Weider, MD 12/10/13 2207 

## 2014-01-18 ENCOUNTER — Emergency Department (HOSPITAL_COMMUNITY)
Admission: EM | Admit: 2014-01-18 | Discharge: 2014-01-18 | Disposition: A | Discharge status: Home / Self Care | Payer: Self-pay | Attending: Emergency Medicine | Admitting: Emergency Medicine

## 2014-01-18 ENCOUNTER — Encounter (HOSPITAL_COMMUNITY): Payer: Self-pay | Admitting: Emergency Medicine

## 2014-01-18 DIAGNOSIS — Z792 Long term (current) use of antibiotics: Secondary | ICD-10-CM | POA: Insufficient documentation

## 2014-01-18 DIAGNOSIS — F17200 Nicotine dependence, unspecified, uncomplicated: Secondary | ICD-10-CM | POA: Insufficient documentation

## 2014-01-18 DIAGNOSIS — R197 Diarrhea, unspecified: Secondary | ICD-10-CM | POA: Insufficient documentation

## 2014-01-18 DIAGNOSIS — Z79899 Other long term (current) drug therapy: Secondary | ICD-10-CM | POA: Insufficient documentation

## 2014-01-18 DIAGNOSIS — N898 Other specified noninflammatory disorders of vagina: Secondary | ICD-10-CM | POA: Insufficient documentation

## 2014-01-18 DIAGNOSIS — N390 Urinary tract infection, site not specified: Secondary | ICD-10-CM | POA: Insufficient documentation

## 2014-01-18 DIAGNOSIS — Z3202 Encounter for pregnancy test, result negative: Secondary | ICD-10-CM | POA: Insufficient documentation

## 2014-01-18 DIAGNOSIS — Z791 Long term (current) use of non-steroidal anti-inflammatories (NSAID): Secondary | ICD-10-CM | POA: Insufficient documentation

## 2014-01-18 DIAGNOSIS — M546 Pain in thoracic spine: Secondary | ICD-10-CM | POA: Insufficient documentation

## 2014-01-18 DIAGNOSIS — R110 Nausea: Secondary | ICD-10-CM | POA: Insufficient documentation

## 2014-01-18 LAB — CBC WITH DIFFERENTIAL/PLATELET
Basophils Absolute: 0.1 10*3/uL (ref 0.0–0.1)
Basophils Relative: 0 % (ref 0–1)
Eosinophils Absolute: 0.5 10*3/uL (ref 0.0–0.7)
Eosinophils Relative: 4 % (ref 0–5)
HCT: 40.9 % (ref 36.0–46.0)
HEMOGLOBIN: 13.6 g/dL (ref 12.0–15.0)
Lymphocytes Relative: 30 % (ref 12–46)
Lymphs Abs: 3.6 10*3/uL (ref 0.7–4.0)
MCH: 29.8 pg (ref 26.0–34.0)
MCHC: 33.3 g/dL (ref 30.0–36.0)
MCV: 89.7 fL (ref 78.0–100.0)
MONOS PCT: 5 % (ref 3–12)
Monocytes Absolute: 0.6 10*3/uL (ref 0.1–1.0)
NEUTROS ABS: 7.2 10*3/uL (ref 1.7–7.7)
NEUTROS PCT: 61 % (ref 43–77)
PLATELETS: 269 10*3/uL (ref 150–400)
RBC: 4.56 MIL/uL (ref 3.87–5.11)
RDW: 12.2 % (ref 11.5–15.5)
WBC: 11.9 10*3/uL — ABNORMAL HIGH (ref 4.0–10.5)

## 2014-01-18 LAB — BASIC METABOLIC PANEL
ANION GAP: 12 (ref 5–15)
BUN: 5 mg/dL — ABNORMAL LOW (ref 6–23)
CO2: 25 mEq/L (ref 19–32)
CREATININE: 0.72 mg/dL (ref 0.50–1.10)
Calcium: 9.4 mg/dL (ref 8.4–10.5)
Chloride: 103 mEq/L (ref 96–112)
GFR calc Af Amer: >90 mL/min (ref >90)
GFR calc non Af Amer: >90 mL/min (ref >90)
Glucose, Bld: 88 mg/dL (ref 70–99)
POTASSIUM: 3.8 meq/L (ref 3.7–5.3)
Sodium: 140 mEq/L (ref 137–147)

## 2014-01-18 LAB — URINALYSIS, ROUTINE W REFLEX MICROSCOPIC
Bilirubin Urine: NEGATIVE
Glucose, UA: NEGATIVE mg/dL
Ketones, ur: NEGATIVE mg/dL
NITRITE: NEGATIVE
Protein, ur: NEGATIVE mg/dL
SPECIFIC GRAVITY, URINE: 1.012 (ref 1.005–1.030)
Urobilinogen, UA: 0.2 mg/dL (ref 0.0–1.0)
pH: 6 (ref 5.0–8.0)

## 2014-01-18 LAB — URINE MICROSCOPIC-ADD ON

## 2014-01-18 LAB — POC URINE PREG, ED: Preg Test, Ur: NEGATIVE

## 2014-01-18 MED ORDER — CEPHALEXIN 500 MG PO CAPS: 500.0000 mg | ORAL_CAPSULE | Freq: Three times a day (TID) | ORAL | Status: DC

## 2014-01-18 NOTE — ED Provider Notes (Signed)
CSN: [id number]     Arrival date & time 01/18/14  1803 History   First MD Initiated Contact with Patient 01/18/14 2105     Chief Complaint  Patient presents with  . Abdominal Pain  . Diarrhea  . Vaginal Discharge     (Consider location/radiation/quality/duration/timing/severity/associated sxs/prior Treatment) HPI 23 year old female presents with 2 weeks of abdominal pain. States is mostly lower but also having some upper) to her back. The pain is intermittent at this time as not bad. She not want any pain medicines. She has a cramping-like pain that is worse than normal a normal period. She's also been having diarrhea over this time. Before this last couple weeks she had intermittent diarrhea. States his been up to 5 times per day. As a watery stool but no blood. No current antibiotics or recent antibiotics. Denies any dysuria or hematuria. Had vaginal discharge last week for this since resolved. She has been in a steady relationship for 3 years and does not think she has an STD or pelvic infection. No discharge for several days.  Past Medical History  Diagnosis Date  . Headache(784.0)   . Bartholin cyst   . Back pain   . Chiari malformation    Past Surgical History  Procedure Laterality Date  . Eye surgery      AS CHILD   . Ears      TUBES IN EARS   . Suboccipital craniectomy cervical laminectomy  05/13/2011    Procedure: SUBOCCIPITAL CRANIECTOMY CERVICAL LAMINECTOMY/DURAPLASTY;  Surgeon: Carmela Hurt;  Location: [facility];  Service: Neurosurgery;  Laterality: N/A;  CHIARI DECOMPRESSION   No family history on file. History  Substance Use Topics  . Smoking status: Current Every Day Smoker -- 0.50 packs/day  . Smokeless tobacco: Never Used  . Alcohol Use: No   OB History   Grav Para Term Preterm Abortions TAB SAB Ect Mult Living   0              Review of Systems  Constitutional: Negative for fever.  Gastrointestinal: Positive for nausea, abdominal pain and diarrhea.  Negative for vomiting.  Genitourinary: Positive for vaginal discharge. Negative for dysuria, vaginal bleeding and menstrual problem (has a mirena).  Musculoskeletal: Positive for back pain.  All other systems reviewed and are negative.     Allergies  Hydrocodone-acetaminophen  Home Medications   Prior to Admission medications   Medication Sig Start Date End Date Taking? Authorizing Provider  cephALEXin (KEFLEX) 500 MG capsule Take 1 capsule (500 mg total) by mouth 3 (three) times daily. X 10 days 01/18/14   Audree Camel, MD  ibuprofen (ADVIL,MOTRIN) 800 MG tablet Take 1 tablet (800 mg total) by mouth 3 (three) times daily. 09/04/13   Sunnie Nielsen, MD  LYSINE PO Take 1 tablet by mouth daily.    Historical Provider, MD  meclizine (ANTIVERT) 25 MG tablet Take 1 tablet (25 mg total) by mouth 4 (four) times daily. 10/14/13   Elson Areas, PA-C  traMADol (ULTRAM) 50 MG tablet Take 1 tablet (50 mg total) by mouth every 6 (six) hours as needed. 09/04/13   Sunnie Nielsen, MD  VITAMIN E PO Take 1 tablet by mouth daily.    Historical Provider, MD   BP 124/70  Pulse 74  Temp(Src) 98.9 F (37.2 C) (Oral)  Resp 18  SpO2 100% Physical Exam  Nursing note and vitals reviewed. Constitutional: She is oriented to person, place, and time. She appears well-developed and well-nourished. No distress.  HENT:  Head: Normocephalic and atraumatic.  Right Ear: External ear normal.  Left Ear: External ear normal.  Nose: Nose normal.  Eyes: Right eye exhibits no discharge. Left eye exhibits no discharge.  Cardiovascular: Normal rate, regular rhythm and normal heart sounds.   Pulmonary/Chest: Effort normal and breath sounds normal.  Abdominal: Soft. Normal appearance. There is tenderness (mild lower abdominal tenderness). There is CVA tenderness (bilateral cva tenderness).  Neurological: She is alert and oriented to person, place, and time.  Skin: Skin is warm and dry.    ED Course  Procedures (including  critical care time) Labs Review Labs Reviewed  BASIC METABOLIC PANEL - Abnormal; Notable for the following:    BUN 5 (*)    All other components within normal limits  CBC WITH DIFFERENTIAL - Abnormal; Notable for the following:    WBC 11.9 (*)    All other components within normal limits  URINALYSIS, ROUTINE W REFLEX MICROSCOPIC - Abnormal; Notable for the following:    APPearance CLOUDY (*)    Hgb urine dipstick SMALL (*)    Leukocytes, UA MODERATE (*)    All other components within normal limits  URINE MICROSCOPIC-ADD ON - Abnormal; Notable for the following:    Bacteria, UA FEW (*)    All other components within normal limits  POC URINE PREG, ED    Imaging Review No results found.   EKG Interpretation None      MDM   Final diagnoses:  UTI (lower urinary tract infection)  Diarrhea    Patient's abdominal exam is benign. She does have CVA tenderness with moderate leukocytes in her urine her symptoms are most likely explained as a UTI/pyelonephritis. We'll treat with Keflex. I offered a pelvic exam given her vaginal discharge last week to rule out a pelvic infection but she declines as she feels that she's not have any type of pelvic infection. I highly doubt appendicitis, ovarian torsion, bowel obstruction, or colitis. Her diarrhea is of uncertain etiology, but she appears well-hydrated is not in need of any fluids. Her laboratory testing is benign. At this time we'll refer her to the gynecology as she wants to have her Mirena taken out, and we'll give her GI followup for her over month-long diarrhea.    Audree CamelScott T Dejay Kronk, MD 01/18/14 2137

## 2014-01-18 NOTE — ED Notes (Signed)
Pt presents with c/o diarrhea x a couple of months, abdominal pain x 2 weeks and vaginal discharge for about one week. Pt says she has been nauseated but no vomiting.

## 2014-01-18 NOTE — Discharge Instructions (Signed)
Diarrhea Diarrhea is frequent loose and watery bowel movements. It can cause you to feel weak and dehydrated. Dehydration can cause you to become tired and thirsty, have a dry mouth, and have decreased urination that often is dark yellow. Diarrhea is a sign of another problem, most often an infection that will not last long. In most cases, diarrhea typically lasts 2-3 days. However, it can last longer if it is a sign of something more serious. It is important to treat your diarrhea as directed by your caregive to lessen or prevent future episodes of diarrhea. CAUSES  Some common causes include:  Gastrointestinal infections caused by viruses, bacteria, or parasites.  Food poisoning or food allergies.  Certain medicines, such as antibiotics, chemotherapy, and laxatives.  Artificial sweeteners and fructose.  Digestive disorders. HOME CARE INSTRUCTIONS  Ensure adequate fluid intake (hydration): have 1 cup (8 oz) of fluid for each diarrhea episode. Avoid fluids that contain simple sugars or sports drinks, fruit juices, whole milk products, and sodas. Your urine should be clear or pale yellow if you are drinking enough fluids. Hydrate with an oral rehydration solution that you can purchase at pharmacies, retail stores, and online. You can prepare an oral rehydration solution at home by mixing the following ingredients together:   - tsp table salt.   tsp baking soda.   tsp salt substitute containing potassium chloride.  1  tablespoons sugar.  1 L (34 oz) of water.  Certain foods and beverages may increase the speed at which food moves through the gastrointestinal (GI) tract. These foods and beverages should be avoided and include:  Caffeinated and alcoholic beverages.  High-fiber foods, such as raw fruits and vegetables, nuts, seeds, and whole grain breads and cereals.  Foods and beverages sweetened with sugar alcohols, such as xylitol, sorbitol, and mannitol.  Some foods may be well  tolerated and may help thicken stool including:  Starchy foods, such as rice, toast, pasta, low-sugar cereal, oatmeal, grits, baked potatoes, crackers, and bagels.  Bananas.  Applesauce.  Add probiotic-rich foods to help increase healthy bacteria in the GI tract, such as yogurt and fermented milk products.  Wash your hands well after each diarrhea episode.  Only take over-the-counter or prescription medicines as directed by your caregiver.  Take a warm bath to relieve any burning or pain from frequent diarrhea episodes. SEEK IMMEDIATE MEDICAL CARE IF:   You are unable to keep fluids down.  You have persistent vomiting.  You have blood in your stool, or your stools are black and tarry.  You do not urinate in 6-8 hours, or there is only a small amount of very dark urine.  You have abdominal pain that increases or localizes.  You have weakness, dizziness, confusion, or lightheadedness.  You have a severe headache.  Your diarrhea gets worse or does not get better.  You have a fever or persistent symptoms for more than 2-3 days.  You have a fever and your symptoms suddenly get worse. MAKE SURE YOU:   Understand these instructions.  Will watch your condition.  Will get help right away if you are not doing well or get worse. Document Released: 06/07/2002 Document Revised: 06/03/2012 Document Reviewed: 02/23/2012 [facility] Patient Information 2015 [place], [place]. This information is not intended to replace advice given to you by your health care provider. Make sure you discuss any questions you have with your health care provider.   Abdominal Pain Many things can cause abdominal pain. Usually, abdominal pain is not caused by  a disease and will improve without treatment. It can often be observed and treated at home. Your health care provider will do a physical exam and possibly order blood tests and X-rays to help determine the seriousness of your pain. However, in many cases,  more time must pass before a clear cause of the pain can be found. Before that point, your health care provider may not know if you need more testing or further treatment. HOME CARE INSTRUCTIONS  Monitor your abdominal pain for any changes. The following actions may help to alleviate any discomfort you are experiencing:  Only take over-the-counter or prescription medicines as directed by your health care provider.  Do not take laxatives unless directed to do so by your health care provider.  Try a clear liquid diet (broth, tea, or water) as directed by your health care provider. Slowly move to a bland diet as tolerated. SEEK MEDICAL CARE IF:  You have unexplained abdominal pain.  You have abdominal pain associated with nausea or diarrhea.  You have pain when you urinate or have a bowel movement.  You experience abdominal pain that wakes you in the night.  You have abdominal pain that is worsened or improved by eating food.  You have abdominal pain that is worsened with eating fatty foods.  You have a fever. SEEK IMMEDIATE MEDICAL CARE IF:   Your pain does not go away within 2 hours.  You keep throwing up (vomiting).  Your pain is felt only in portions of the abdomen, such as the right side or the left lower portion of the abdomen.  You pass bloody or black tarry stools. MAKE SURE YOU:  Understand these instructions.   Will watch your condition.   Will get help right away if you are not doing well or get worse.  Document Released: 03/27/2005 Document Revised: 06/22/2013 Document Reviewed: 02/24/2013 [facility] Patient Information 2015 [place], [place]. This information is not intended to replace advice given to you by your health care provider. Make sure you discuss any questions you have with your health care provider.    Urinary Tract Infection Urinary tract infections (UTIs) can develop anywhere along your urinary tract. Your urinary tract is your body's drainage  system for removing wastes and extra water. Your urinary tract includes two kidneys, two ureters, a bladder, and a urethra. Your kidneys are a pair of bean-shaped organs. Each kidney is about the size of your fist. They are located below your ribs, one on each side of your spine. CAUSES Infections are caused by microbes, which are microscopic organisms, including fungi, viruses, and bacteria. These organisms are so small that they can only be seen through a microscope. Bacteria are the microbes that most commonly cause UTIs. SYMPTOMS  Symptoms of UTIs may vary by age and gender of the patient and by the location of the infection. Symptoms in young women typically include a frequent and intense urge to urinate and a painful, burning feeling in the bladder or urethra during urination. Older women and men are more likely to be tired, shaky, and weak and have muscle aches and abdominal pain. A fever may mean the infection is in your kidneys. Other symptoms of a kidney infection include pain in your back or sides below the ribs, nausea, and vomiting. DIAGNOSIS To diagnose a UTI, your caregiver will ask you about your symptoms. Your caregiver also will ask to provide a urine sample. The urine sample will be tested for bacteria and white blood cells. White blood  cells are made by your body to help fight infection. TREATMENT  Typically, UTIs can be treated with medication. Because most UTIs are caused by a bacterial infection, they usually can be treated with the use of antibiotics. The choice of antibiotic and length of treatment depend on your symptoms and the type of bacteria causing your infection. HOME CARE INSTRUCTIONS  If you were prescribed antibiotics, take them exactly as your caregiver instructs you. Finish the medication even if you feel better after you have only taken some of the medication.  Drink enough water and fluids to keep your urine clear or pale yellow.  Avoid caffeine, tea, and  carbonated beverages. They tend to irritate your bladder.  Empty your bladder often. Avoid holding urine for long periods of time.  Empty your bladder before and after sexual intercourse.  After a bowel movement, women should cleanse from front to back. Use each tissue only once. SEEK MEDICAL CARE IF:   You have back pain.  You develop a fever.  Your symptoms do not begin to resolve within 3 days. SEEK IMMEDIATE MEDICAL CARE IF:   You have severe back pain or lower abdominal pain.  You develop chills.  You have nausea or vomiting.  You have continued burning or discomfort with urination. MAKE SURE YOU:   Understand these instructions.  Will watch your condition.  Will get help right away if you are not doing well or get worse. Document Released: 03/27/2005 Document Revised: 12/17/2011 Document Reviewed: 07/26/2011 [facility] Patient Information 2015 [place]. This information is not intended to replace advice given to you by your health care provider. Make sure you discuss any questions you have with your health care provider.

## 2015-07-17 ENCOUNTER — Emergency Department (HOSPITAL_COMMUNITY)
Admission: EM | Admit: 2015-07-17 | Discharge: 2015-07-17 | Disposition: A | Discharge status: Home / Self Care | Payer: No Typology Code available for payment source | Attending: Emergency Medicine | Admitting: Emergency Medicine

## 2015-07-17 ENCOUNTER — Encounter (HOSPITAL_COMMUNITY): Payer: Self-pay | Admitting: Emergency Medicine

## 2015-07-17 ENCOUNTER — Emergency Department (HOSPITAL_COMMUNITY): Payer: No Typology Code available for payment source

## 2015-07-17 DIAGNOSIS — Z8742 Personal history of other diseases of the female genital tract: Secondary | ICD-10-CM | POA: Diagnosis not present

## 2015-07-17 DIAGNOSIS — Y998 Other external cause status: Secondary | ICD-10-CM | POA: Insufficient documentation

## 2015-07-17 DIAGNOSIS — Y9389 Activity, other specified: Secondary | ICD-10-CM | POA: Diagnosis not present

## 2015-07-17 DIAGNOSIS — Y92410 Unspecified street and highway as the place of occurrence of the external cause: Secondary | ICD-10-CM | POA: Insufficient documentation

## 2015-07-17 DIAGNOSIS — F17200 Nicotine dependence, unspecified, uncomplicated: Secondary | ICD-10-CM | POA: Insufficient documentation

## 2015-07-17 DIAGNOSIS — S29001A Unspecified injury of muscle and tendon of front wall of thorax, initial encounter: Secondary | ICD-10-CM | POA: Insufficient documentation

## 2015-07-17 DIAGNOSIS — Z791 Long term (current) use of non-steroidal anti-inflammatories (NSAID): Secondary | ICD-10-CM | POA: Diagnosis not present

## 2015-07-17 DIAGNOSIS — S161XXA Strain of muscle, fascia and tendon at neck level, initial encounter: Secondary | ICD-10-CM

## 2015-07-17 DIAGNOSIS — Z79899 Other long term (current) drug therapy: Secondary | ICD-10-CM | POA: Insufficient documentation

## 2015-07-17 DIAGNOSIS — M791 Myalgia, unspecified site: Secondary | ICD-10-CM

## 2015-07-17 DIAGNOSIS — S199XXA Unspecified injury of neck, initial encounter: Secondary | ICD-10-CM | POA: Diagnosis present

## 2015-07-17 MED ORDER — NAPROXEN 500 MG PO TABS: 500.0000 mg | ORAL_TABLET | Freq: Two times a day (BID) | ORAL | Status: DC

## 2015-07-17 MED ORDER — KETOROLAC TROMETHAMINE 60 MG/2ML IM SOLN
60.0000 mg | Freq: Once | INTRAMUSCULAR | Status: AC
  Administered 2015-07-17: 60 mg via INTRAMUSCULAR
  Filled 2015-07-17: qty 2

## 2015-07-17 MED ORDER — CYCLOBENZAPRINE HCL 10 MG PO TABS: 10.0000 mg | ORAL_TABLET | Freq: Two times a day (BID) | ORAL | Status: DC | PRN

## 2015-07-17 MED ORDER — HEPARIN SODIUM (PORCINE) 1000 UNIT/ML IJ SOLN: 7000.0000 [IU] | Freq: Once | INTRAMUSCULAR | Status: DC
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
## 2015-07-17 NOTE — ED Notes (Signed)
[facility].
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
## 2015-09-16 IMAGING — CR DG FOOT COMPLETE 3+V*L*
3 series · 3 of 3 positions shown · non-contrast
Comparison: 07/11/2013

CLINICAL DATA: Basketball injury 2 weeks ago. Rule out Lisfranc
injury.

EXAM:
LEFT FOOT - COMPLETE 3+ VIEW

[view not recorded (1 of 3)]
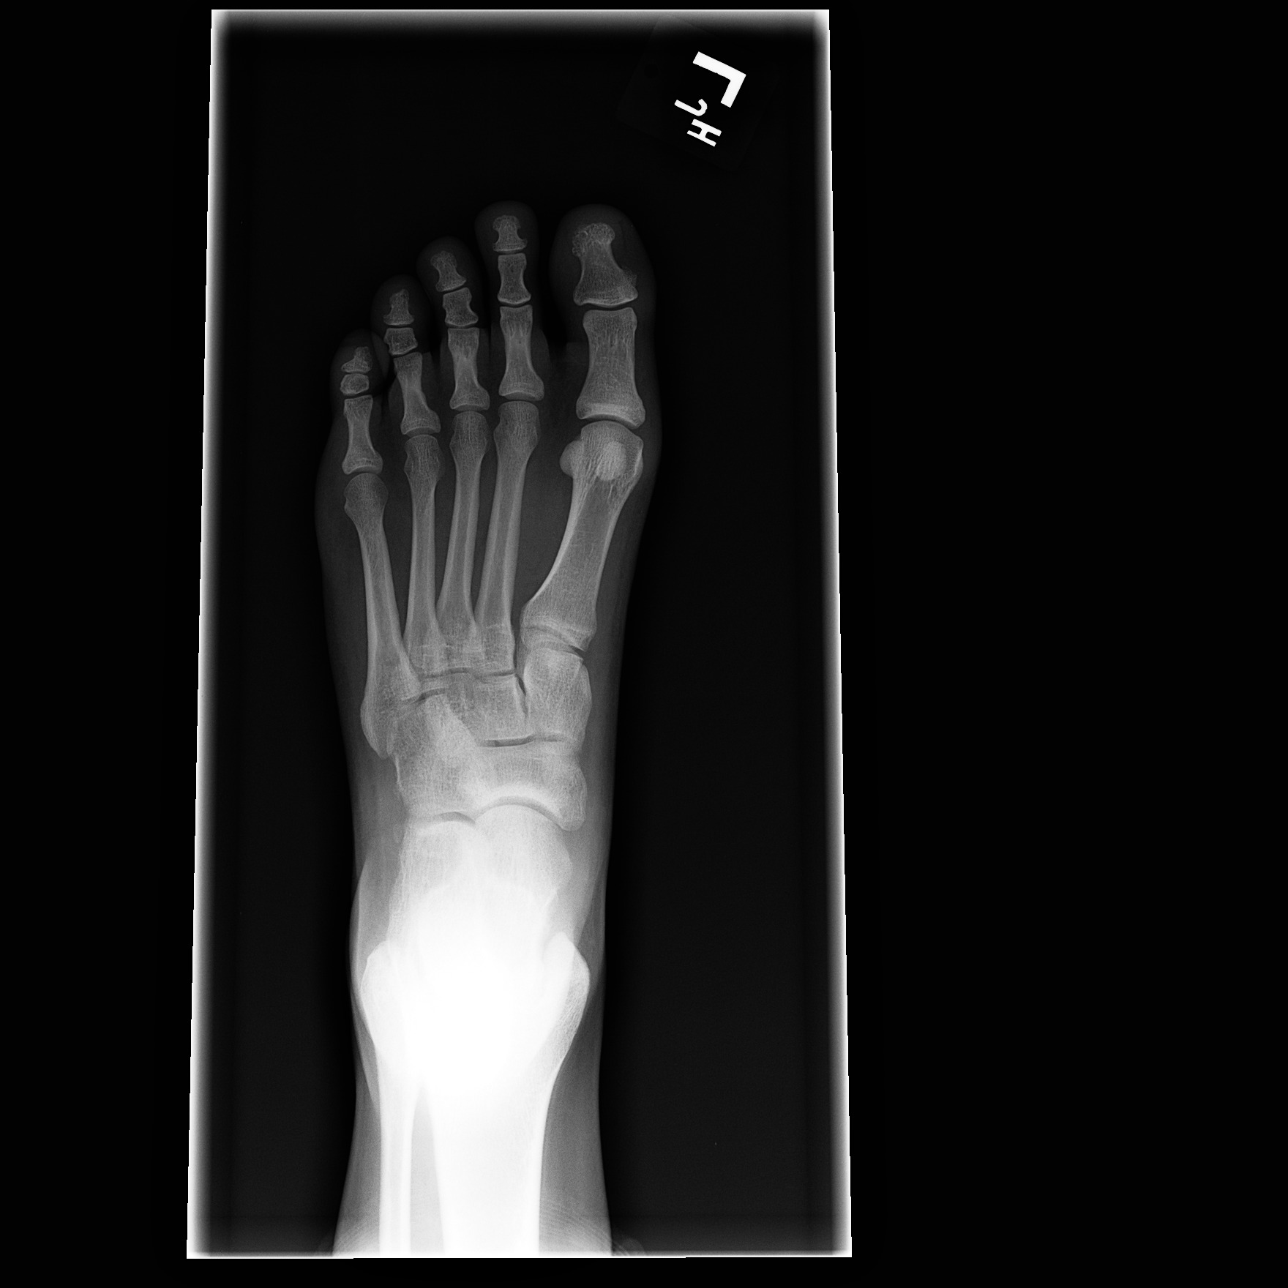

[view not recorded (2 of 3)]
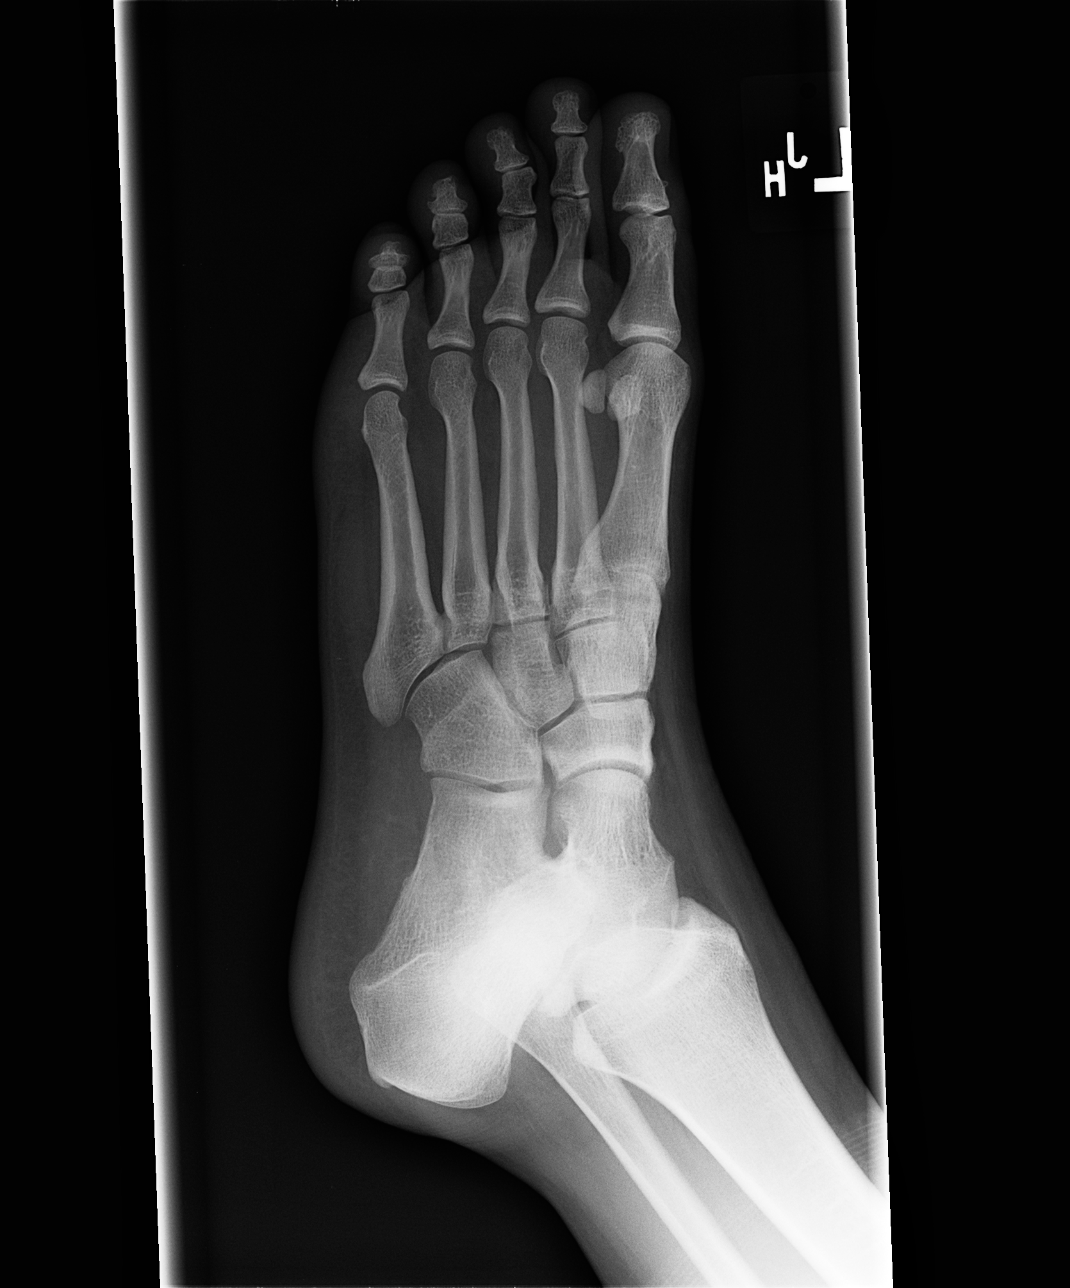

[view not recorded (3 of 3)]
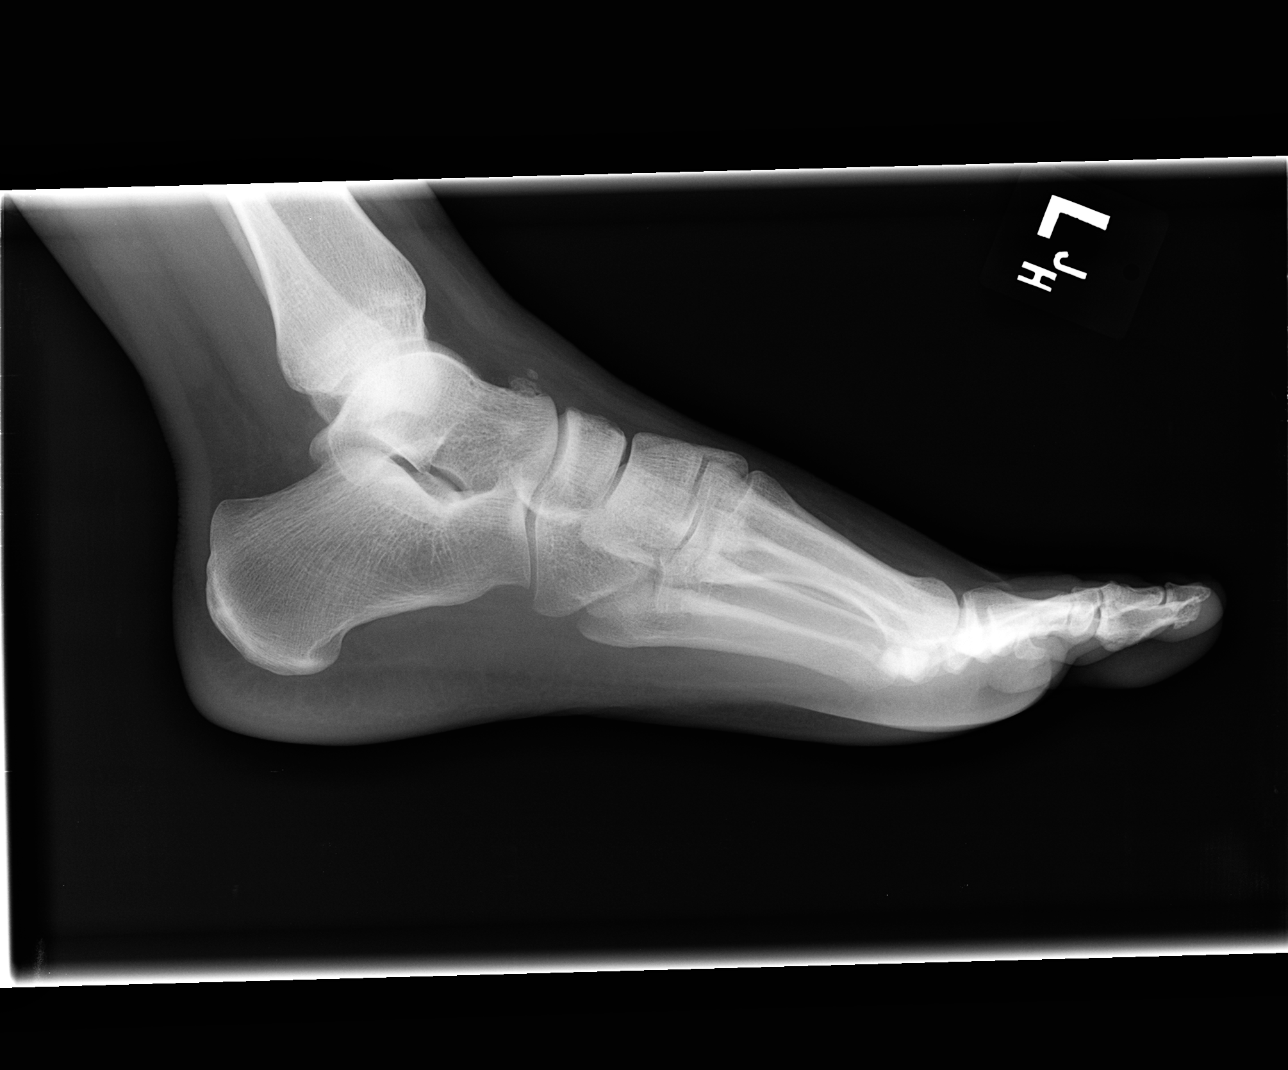

[3 of 3 positions shown; findings below may reference images not displayed]

FINDINGS: Avulsion fracture the dorsal navicular no longer visualized. This
probably has resorbed however some of the difference may be due to
obliquity.

There is well corticated calcification dorsal to the neck of the
talus which is unchanged and may be due to old injury.

Negative for Lisfranc  injury.  No other fracture identified.
IMPRESSION: Dorsal navicular fracture no longer visualized. Probable chronic
fracture of the talus. No new findings.

## 2015-09-16 IMAGING — CR DG FOOT COMPLETE 3+V*R*
3 series · 3 of 3 positions shown · non-contrast
Comparison: None.

CLINICAL DATA: Basketball injury 2 weeks ago.

EXAM:
RIGHT FOOT COMPLETE - 3+ VIEW

[view not recorded (1 of 3)]
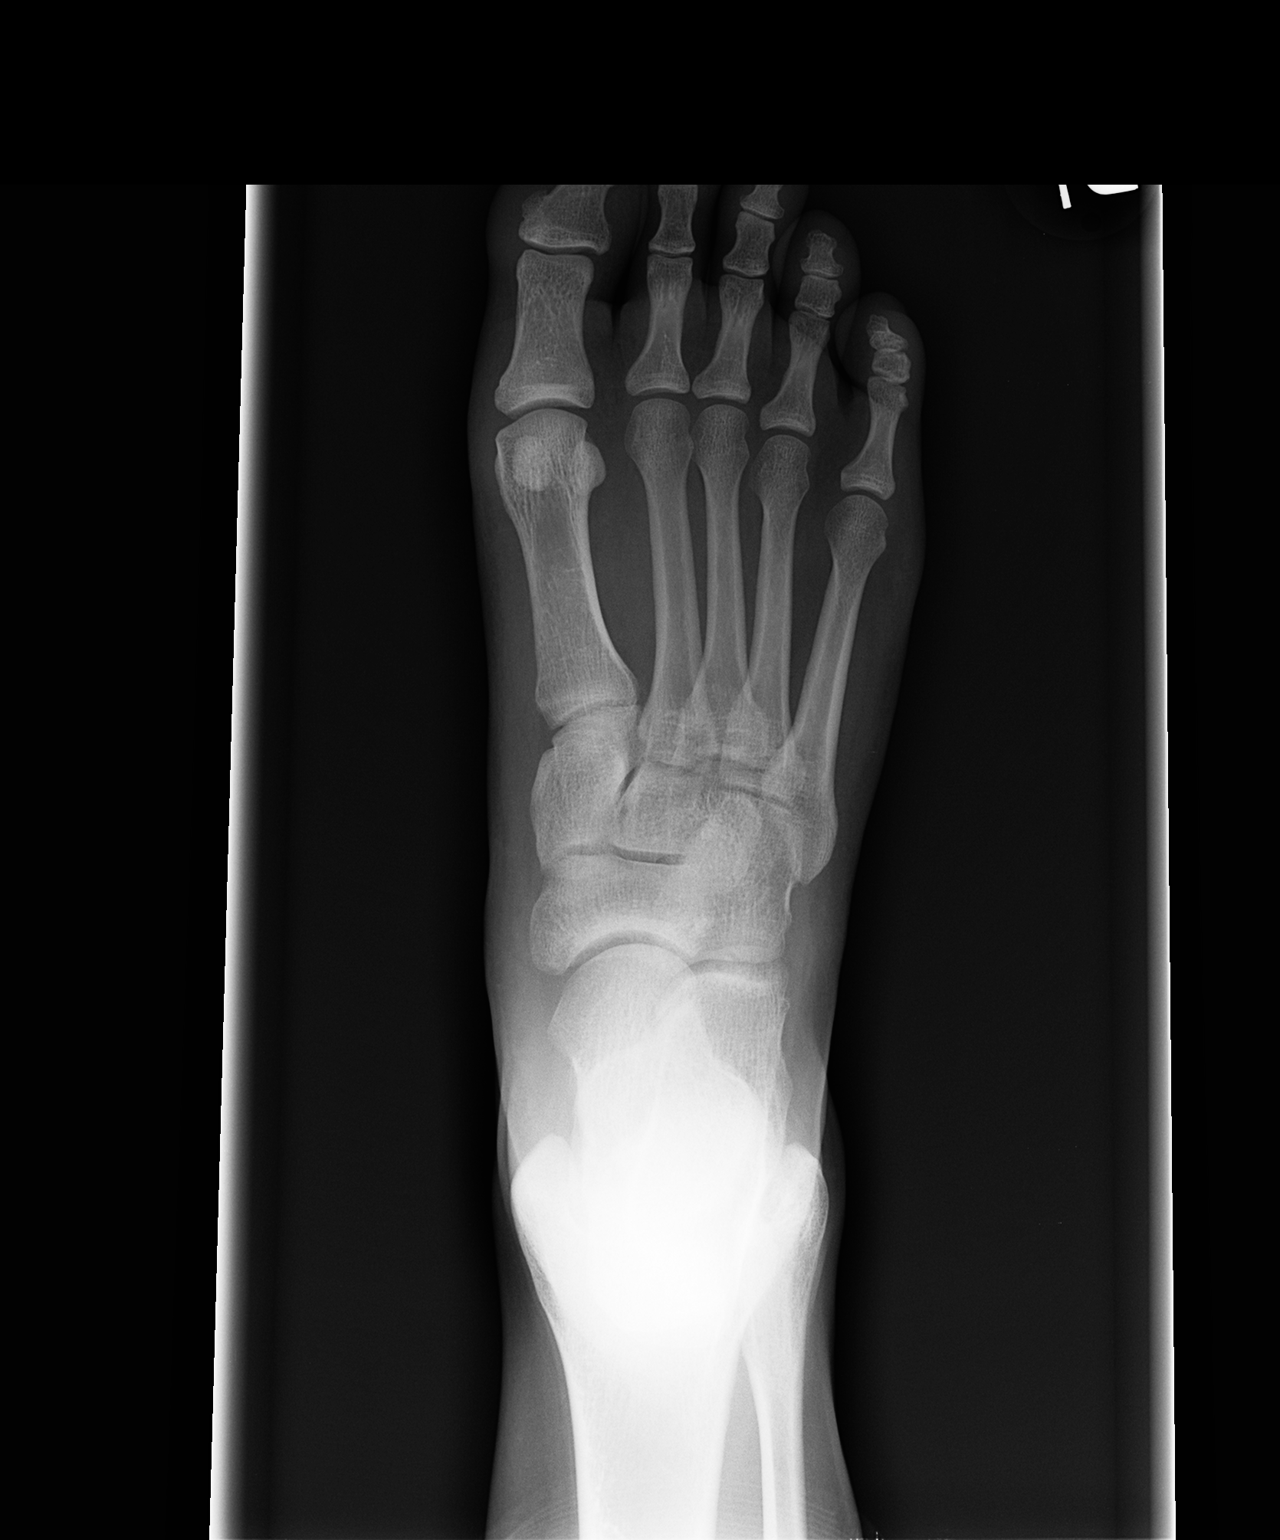

[view not recorded (2 of 3)]
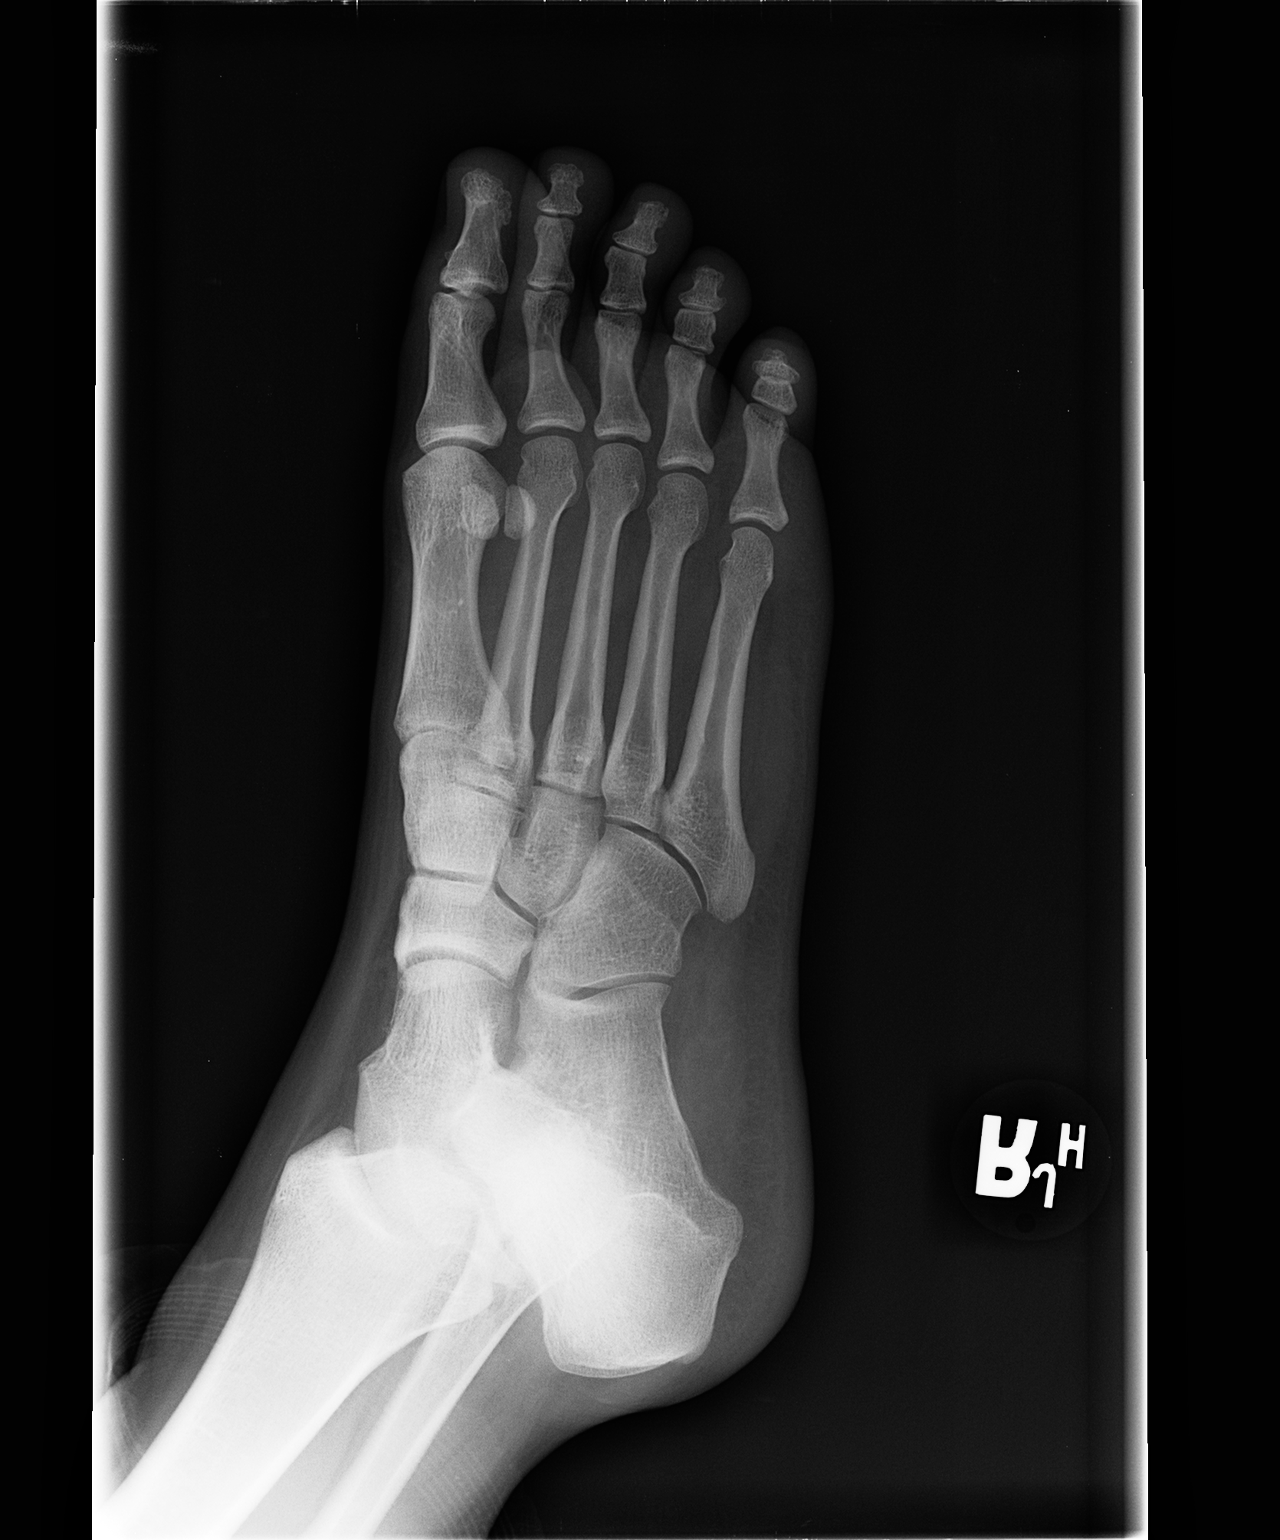

[view not recorded (3 of 3)]
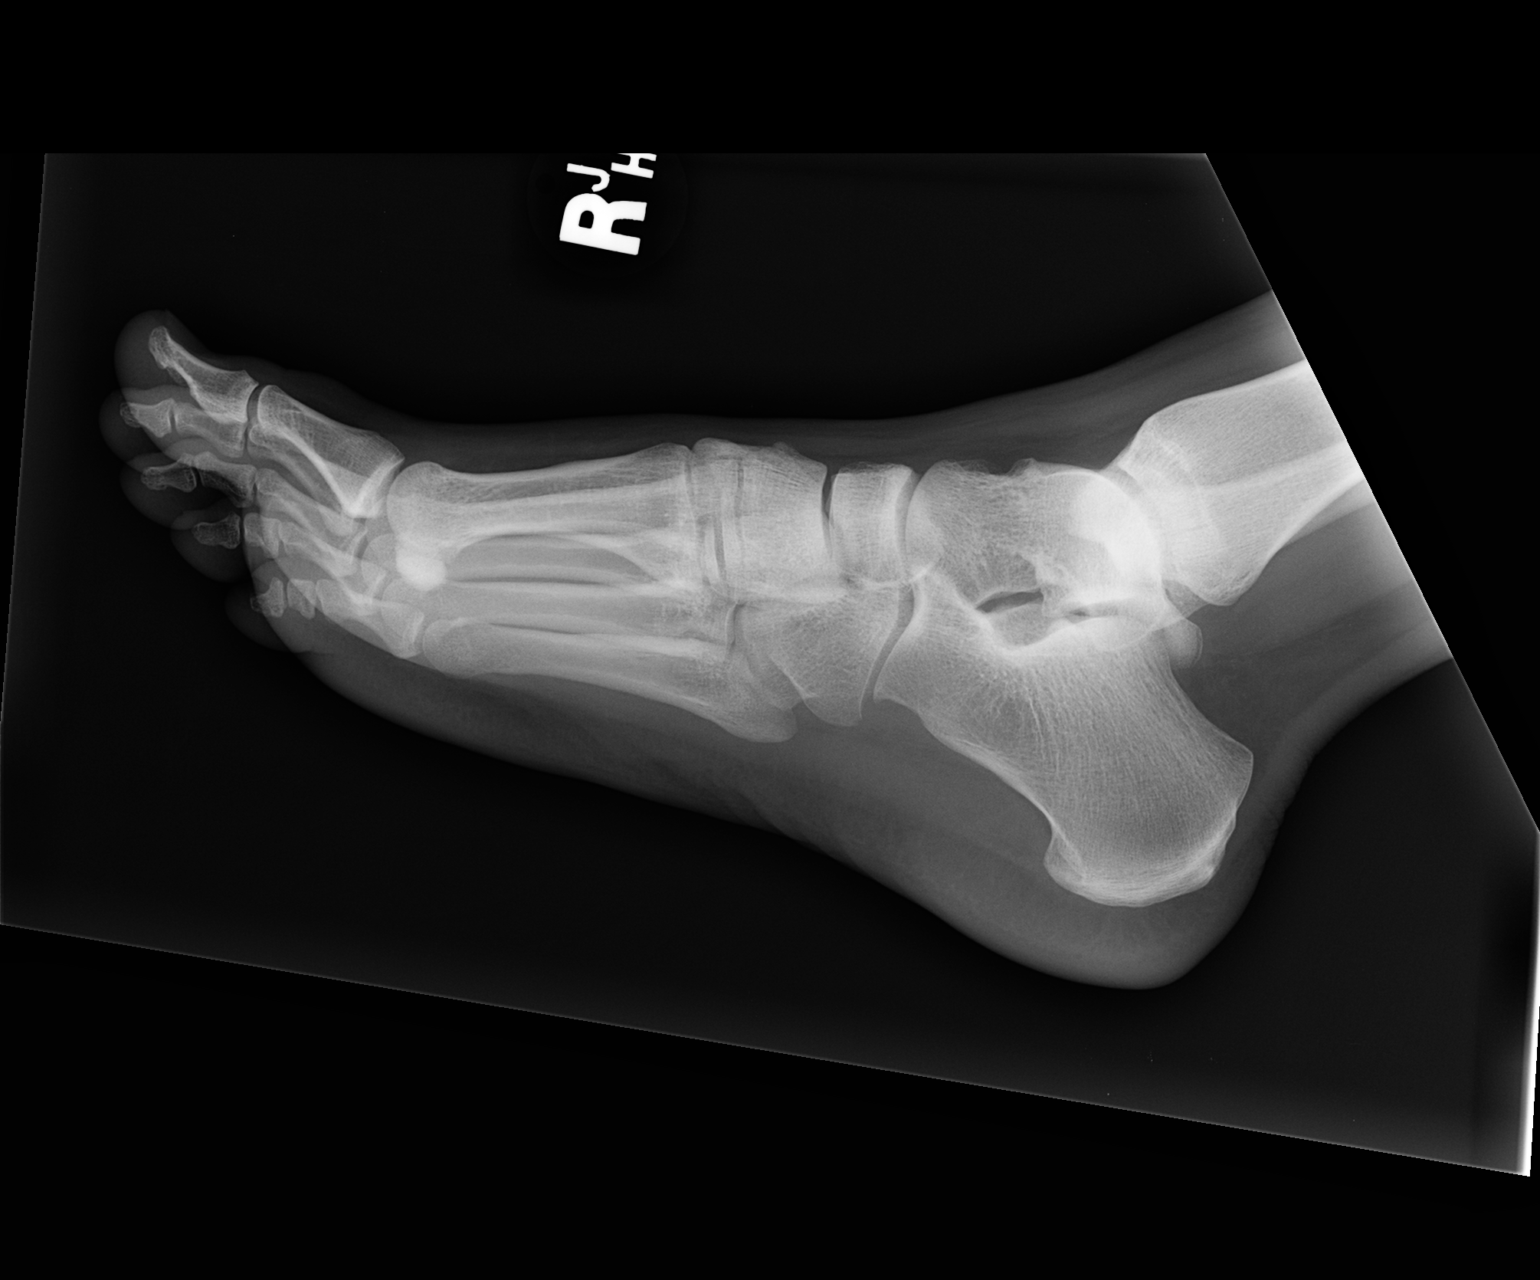

[3 of 3 positions shown; findings below may reference images not displayed]

FINDINGS: There is no evidence of fracture or dislocation. There is no
evidence of arthropathy or other focal bone abnormality. Soft
tissues are unremarkable.
IMPRESSION: Negative.

## 2016-07-26 ENCOUNTER — Encounter (HOSPITAL_COMMUNITY): Payer: Self-pay | Admitting: *Deleted

## 2016-07-26 ENCOUNTER — Ambulatory Visit (HOSPITAL_COMMUNITY)
Admission: EM | Admit: 2016-07-26 | Discharge: 2016-07-26 | Disposition: A | Discharge status: Home / Self Care | Payer: Self-pay | Attending: Family Medicine | Admitting: Family Medicine

## 2016-07-26 DIAGNOSIS — R0789 Other chest pain: Secondary | ICD-10-CM

## 2016-07-26 MED ORDER — DICLOFENAC POTASSIUM 50 MG PO TABS: 50.0000 mg | ORAL_TABLET | Freq: Three times a day (TID) | ORAL | 0 refills | Status: DC

## 2016-07-26 MED ORDER — BENZONATATE 200 MG PO CAPS: 200.0000 mg | ORAL_CAPSULE | Freq: Three times a day (TID) | ORAL | 0 refills | Status: DC | PRN

## 2016-07-26 NOTE — ED Triage Notes (Signed)
Pain  Is   Worse    On  Palpation        midsternal

## 2016-07-26 NOTE — ED Provider Notes (Signed)
[facility]    CSN: [id number] Arrival date & time: 07/26/16  1558     History   Chief Complaint No chief complaint on file.   HPI Taylor Garcia is a 26 y.o. female.   The history is provided by the patient.  Chest Pain  Pain location:  L chest Pain quality: sharp   Pain radiates to:  Does not radiate Pain severity:  Mild Onset quality:  Gradual Duration:  1 week Chronicity:  New Context: lifting and movement   Relieved by:  None tried Worsened by:  Nothing Ineffective treatments:  None tried   Past Medical History:  Diagnosis Date  . Back pain   . Bartholin cyst   . Chiari malformation   . MVHQIONG(295.2)     Patient Active Problem List   Diagnosis Date Noted  . Chiari malformation   . PID (acute pelvic inflammatory disease) 02/18/2012  . Presence of intrauterine contraceptive device (IUD) 02/18/2012  . ANKLE SPRAIN, LEFT 09/22/2009    Past Surgical History:  Procedure Laterality Date  . EARS     TUBES IN EARS   . EYE SURGERY     AS CHILD   . SUBOCCIPITAL CRANIECTOMY CERVICAL LAMINECTOMY  05/13/2011   Procedure: SUBOCCIPITAL CRANIECTOMY CERVICAL LAMINECTOMY/DURAPLASTY;  Surgeon: Carmela Hurt;  Location: [facility];  Service: Neurosurgery;  Laterality: N/A;  CHIARI DECOMPRESSION    OB History    Gravida Para Term Preterm AB Living   0             SAB TAB Ectopic Multiple Live Births                   Home Medications    Prior to Admission medications   Medication Sig Start Date End Date Taking? Authorizing Provider  benzonatate (TESSALON) 200 MG capsule Take 1 capsule (200 mg total) by mouth 3 (three) times daily as needed for cough. 07/26/16   Linna Hoff, MD  cephALEXin (KEFLEX) 500 MG capsule Take 1 capsule (500 mg total) by mouth 3 (three) times daily. X 10 days 01/18/14   Pricilla Loveless, MD  cyclobenzaprine (FLEXERIL) 10 MG tablet Take 1 tablet (10 mg total) by mouth 2 (two) times daily as needed for muscle spasms. 07/17/15    Samantha Tripp Dowless, PA-C  diclofenac (CATAFLAM) 50 MG tablet Take 1 tablet (50 mg total) by mouth 3 (three) times daily. For chest pain 07/26/16   Linna Hoff, MD  ibuprofen (ADVIL,MOTRIN) 800 MG tablet Take 1 tablet (800 mg total) by mouth 3 (three) times daily. 09/04/13   Sunnie Nielsen, MD  LYSINE PO Take 1 tablet by mouth daily.    Historical Provider, MD  meclizine (ANTIVERT) 25 MG tablet Take 1 tablet (25 mg total) by mouth 4 (four) times daily. 10/14/13   Elson Areas, PA-C  naproxen (NAPROSYN) 500 MG tablet Take 1 tablet (500 mg total) by mouth 2 (two) times daily. 07/17/15   Samantha Tripp Dowless, PA-C  traMADol (ULTRAM) 50 MG tablet Take 1 tablet (50 mg total) by mouth every 6 (six) hours as needed. 09/04/13   Sunnie Nielsen, MD  VITAMIN E PO Take 1 tablet by mouth daily.    Historical Provider, MD    Family History History reviewed. No pertinent family history.  Social History Social History  Substance Use Topics  . Smoking status: Current Every Day Smoker    Packs/day: 0.50  . Smokeless tobacco: Never Used  . Alcohol use  No     Allergies   Hydrocodone-acetaminophen   Review of Systems Review of Systems  Constitutional: Negative.   Respiratory: Negative.   Cardiovascular: Positive for chest pain.  Gastrointestinal: Negative.   Genitourinary: Negative.   Musculoskeletal: Negative.   All other systems reviewed and are negative.    Physical Exam Triage Vital Signs ED Triage Vitals  Enc Vitals Group     BP 07/26/16 1702 111/76     Pulse Rate 07/26/16 1702 82     Resp 07/26/16 1702 18     Temp 07/26/16 1702 99.2 F (37.3 C)     Temp Source 07/26/16 1702 Oral     SpO2 07/26/16 1702 100 %     Weight --      Height --      Head Circumference --      Peak Flow --      Pain Score 07/26/16 1706 10     Pain Loc --      Pain Edu? --      Excl. in GC? --    No data found.   Updated Vital Signs BP 111/76 (BP Location: Right Arm)   Pulse 82   Temp 99.2 F  (37.3 C) (Oral)   Resp 18   LMP 07/10/2016   SpO2 100%   Visual Acuity Right Eye Distance:   Left Eye Distance:   Bilateral Distance:    Right Eye Near:   Left Eye Near:    Bilateral Near:     Physical Exam  Constitutional: She is oriented to person, place, and time. She appears well-developed and well-nourished. She appears distressed.  Neck: Normal range of motion. Neck supple.  Cardiovascular: Normal rate, regular rhythm, normal heart sounds and intact distal pulses.   Pulmonary/Chest: Effort normal and breath sounds normal. She exhibits tenderness.  Abdominal: Soft. Bowel sounds are normal.  Neurological: She is alert and oriented to person, place, and time.  Skin: Skin is warm and dry.  Nursing note and vitals reviewed.    UC Treatments / Results  Labs (all labs ordered are listed, but only abnormal results are displayed) Labs Reviewed - No data to display  EKG  EKG Interpretation None       Radiology No results found.  Procedures Procedures (including critical care time)  Medications Ordered in UC Medications - No data to display   Initial Impression / Assessment and Plan / UC Course  I have reviewed the triage vital signs and the nursing notes.  Pertinent labs & imaging results that were available during my care of the patient were reviewed by me and considered in my medical decision making (see chart for details).       Final Clinical Impressions(s) / UC Diagnoses   Final diagnoses:  Left-sided chest wall pain    New Prescriptions Discharge Medication List as of 07/26/2016  5:48 PM    START taking these medications   Details  benzonatate (TESSALON) 200 MG capsule Take 1 capsule (200 mg total) by mouth 3 (three) times daily as needed for cough., Starting Fri 07/26/2016, Normal    diclofenac (CATAFLAM) 50 MG tablet Take 1 tablet (50 mg total) by mouth 3 (three) times daily. For chest pain, Starting Fri 07/26/2016, Normal         Linna HoffJames D  Brynnlie Unterreiner, MD 08/01/16 [id number]

## 2016-08-06 ENCOUNTER — Encounter (HOSPITAL_COMMUNITY): Payer: Self-pay | Admitting: Emergency Medicine

## 2016-08-06 ENCOUNTER — Ambulatory Visit (HOSPITAL_COMMUNITY)
Admission: EM | Admit: 2016-08-06 | Discharge: 2016-08-06 | Disposition: A | Discharge status: Home / Self Care | Payer: Self-pay | Attending: Family Medicine | Admitting: Family Medicine

## 2016-08-06 DIAGNOSIS — R6889 Other general symptoms and signs: Secondary | ICD-10-CM

## 2016-08-06 DIAGNOSIS — R05 Cough: Secondary | ICD-10-CM

## 2016-08-06 DIAGNOSIS — R059 Cough, unspecified: Secondary | ICD-10-CM

## 2016-08-06 MED ORDER — ONDANSETRON 4 MG PO TBDP: 4.0000 mg | ORAL_TABLET | Freq: Three times a day (TID) | ORAL | 0 refills | Status: DC | PRN

## 2016-08-06 MED ORDER — BENZONATATE 100 MG PO CAPS: 200.0000 mg | ORAL_CAPSULE | Freq: Three times a day (TID) | ORAL | 0 refills | Status: DC | PRN

## 2016-08-06 MED ORDER — OSELTAMIVIR PHOSPHATE 75 MG PO CAPS: 75.0000 mg | ORAL_CAPSULE | Freq: Two times a day (BID) | ORAL | 0 refills | Status: DC

## 2016-08-06 MED ORDER — IBUPROFEN 800 MG PO TABS
800.0000 mg | ORAL_TABLET | Freq: Once | ORAL | Status: AC
  Administered 2016-08-06: 800 mg via ORAL

## 2016-08-06 NOTE — ED Triage Notes (Signed)
[facility] a [facility] of a cough, headache and emesis that started last night.

## 2016-08-06 NOTE — ED Provider Notes (Signed)
CSN: [id number]     Arrival date & time 08/06/16  1744 History   First MD Initiated Contact with Patient 08/06/16 1934     Chief Complaint  Patient presents with  . Cough   (Consider location/radiation/quality/duration/timing/severity/associated sxs/prior Treatment) Patient c/o headache, cough , and fever.  Patient also c/o nausea and vomiting.   The history is provided by the patient.  Cough  Cough characteristics:  Hacking Sputum characteristics:  Pink Severity:  Moderate Onset quality:  Sudden Duration:  2 days Timing:  Constant Worsened by:  Nothing Associated symptoms: fever     Past Medical History:  Diagnosis Date  . Back pain   . Bartholin cyst   . Chiari malformation   . WUJWJXBJ(478.2Headache(784.0)    Past Surgical History:  Procedure Laterality Date  . EARS     TUBES IN EARS   . EYE SURGERY     AS CHILD   . SUBOCCIPITAL CRANIECTOMY CERVICAL LAMINECTOMY  05/13/2011   Procedure: SUBOCCIPITAL CRANIECTOMY CERVICAL LAMINECTOMY/DURAPLASTY;  Surgeon: Carmela HurtKyle L Cabbell;  Location: [facility];  Service: Neurosurgery;  Laterality: N/A;  CHIARI DECOMPRESSION   History reviewed. No pertinent family history. Social History  Substance Use Topics  . Smoking status: Current Every Day Smoker    Packs/day: 0.50  . Smokeless tobacco: Never Used  . Alcohol use No   OB History    Gravida Para Term Preterm AB Living   0             SAB TAB Ectopic Multiple Live Births                 Review of Systems  Constitutional: Positive for fatigue and fever.  HENT: Negative.   Eyes: Negative.   Respiratory: Positive for cough.   Cardiovascular: Negative.   Gastrointestinal: Positive for nausea.  Endocrine: Negative.   Genitourinary: Negative.   Musculoskeletal: Negative.   Allergic/Immunologic: Negative.   Neurological: Negative.   Hematological: Negative.   Psychiatric/Behavioral: Negative.     Allergies  Hydrocodone-acetaminophen  Home Medications   Prior to Admission  medications   Medication Sig Start Date End Date Taking? Authorizing Provider  benzonatate (TESSALON) 100 MG capsule Take 2 capsules (200 mg total) by mouth 3 (three) times daily as needed for cough. 08/06/16   Deatra CanterWilliam J Jamicia Haaland, FNP  ondansetron (ZOFRAN ODT) 4 MG disintegrating tablet Take 1 tablet (4 mg total) by mouth every 8 (eight) hours as needed for nausea or vomiting. 08/06/16   Deatra CanterWilliam J Raiford Fetterman, FNP  oseltamivir (TAMIFLU) 75 MG capsule Take 1 capsule (75 mg total) by mouth every 12 (twelve) hours. 08/06/16   Deatra CanterWilliam J Coye Dawood, FNP   Meds Ordered and Administered this Visit   Medications  ibuprofen (ADVIL,MOTRIN) tablet 800 mg (800 mg Oral Given 08/06/16 1940)    BP 123/75 (BP Location: Right Arm)   Pulse 118   Temp 99.1 F (37.3 C) (Oral)   Resp 18   LMP 07/10/2016   SpO2 97%  No data found.   Physical Exam  Constitutional: She appears well-developed and well-nourished.  HENT:  Head: Normocephalic and atraumatic.  Right Ear: External ear normal.  Left Ear: External ear normal.  Mouth/Throat: Oropharynx is clear and moist.  Eyes: Conjunctivae and EOM are normal. Pupils are equal, round, and reactive to light.  Neck: Normal range of motion. Neck supple.  Cardiovascular: Normal rate, regular rhythm and normal heart sounds.   Pulmonary/Chest: Effort normal and breath sounds normal.  Abdominal: Soft. Bowel sounds are  normal.  Nursing note and vitals reviewed.   Urgent Care Course     Procedures (including critical care time)  Labs Review Labs Reviewed - No data to display  Imaging Review No results found.   Visual Acuity Review  Right Eye Distance:   Left Eye Distance:   Bilateral Distance:    Right Eye Near:   Left Eye Near:    Bilateral Near:         MDM   1. Flu-like symptoms   2. Cough    Tessalon Perles Zofran Tamiflu  Push po fluids, rest, tylenol and motrin otc prn as directed for fever, arthralgias, and myalgias.  Follow up prn if sx's  continue or persist.    Deatra Canter, FNP 08/06/16 2027

## 2016-12-02 ENCOUNTER — Emergency Department (HOSPITAL_BASED_OUTPATIENT_CLINIC_OR_DEPARTMENT_OTHER)
Admission: EM | Admit: 2016-12-02 | Discharge: 2016-12-03 | Disposition: A | Discharge status: Home / Self Care | Payer: Self-pay | Attending: Emergency Medicine | Admitting: Emergency Medicine

## 2016-12-02 ENCOUNTER — Encounter (HOSPITAL_BASED_OUTPATIENT_CLINIC_OR_DEPARTMENT_OTHER): Payer: Self-pay | Admitting: Emergency Medicine

## 2016-12-02 DIAGNOSIS — H6690 Otitis media, unspecified, unspecified ear: Secondary | ICD-10-CM

## 2016-12-02 DIAGNOSIS — H6691 Otitis media, unspecified, right ear: Secondary | ICD-10-CM | POA: Insufficient documentation

## 2016-12-02 DIAGNOSIS — F17200 Nicotine dependence, unspecified, uncomplicated: Secondary | ICD-10-CM | POA: Insufficient documentation

## 2016-12-02 DIAGNOSIS — H7291 Unspecified perforation of tympanic membrane, right ear: Secondary | ICD-10-CM

## 2016-12-02 NOTE — ED Triage Notes (Signed)
Patient states that she has had drainage with pain to her right ear for about a week. The pateint has a history of ear problems leading to brain surgery in the past

## 2016-12-02 NOTE — ED Provider Notes (Signed)
[facility] Provider Note   CSN: [id number] Arrival date & time: 12/02/16  2025  By signing my name below, I, Deland Pretty, attest that this documentation has been prepared under the direction and in the presence of Rise Mu, PA-C. Electronically Signed: Deland Pretty, ED Scribe. 12/02/16. 11:44 PM.  History   Chief Complaint Chief Complaint  Patient presents with  . Ear Drainage   The history is provided by the patient. No language interpreter was used.   HPI Comments: Taylor Garcia is a 26 y.o. female who presents to the Emergency Department complaining of intermittent, moderate right ear pain with associated jaw pain,  that began about a week and a half ago. The pt indicates that she recently returned from a trip and pain as continued since the trip. Her PMHx indlcudes a suboccipital craniectomy cervical laminectomy in 2012 from a prior diagnosis of chiari malformation with csf leak. Pt Endorses drainage from the right ear. Associated pain with palpation. Denies any fevers, chills, headache, vision changes, neck pain, lightheadedness, dizziness. She has not chart of her symptoms prior to arrival. No recent swimming.   Past Medical History:  Diagnosis Date  . Back pain   . Bartholin cyst   . Chiari malformation   . NFAOZHYQ(657.8)     Patient Active Problem List   Diagnosis Date Noted  . Chiari malformation   . PID (acute pelvic inflammatory disease) 02/18/2012  . Presence of intrauterine contraceptive device (IUD) 02/18/2012  . ANKLE SPRAIN, LEFT 09/22/2009    Past Surgical History:  Procedure Laterality Date  . EARS     TUBES IN EARS   . EYE SURGERY     AS CHILD   . SUBOCCIPITAL CRANIECTOMY CERVICAL LAMINECTOMY  05/13/2011   Procedure: SUBOCCIPITAL CRANIECTOMY CERVICAL LAMINECTOMY/DURAPLASTY;  Surgeon: Carmela Hurt;  Location: [facility];  Service: Neurosurgery;  Laterality: N/A;  CHIARI DECOMPRESSION    OB History    Gravida Para  Term Preterm AB Living   0             SAB TAB Ectopic Multiple Live Births                   Home Medications    Prior to Admission medications   Medication Sig Start Date End Date Taking? Authorizing Provider  benzonatate (TESSALON) 100 MG capsule Take 2 capsules (200 mg total) by mouth 3 (three) times daily as needed for cough. 08/06/16   Deatra Canter, FNP  ondansetron (ZOFRAN ODT) 4 MG disintegrating tablet Take 1 tablet (4 mg total) by mouth every 8 (eight) hours as needed for nausea or vomiting. 08/06/16   Deatra Canter, FNP  oseltamivir (TAMIFLU) 75 MG capsule Take 1 capsule (75 mg total) by mouth every 12 (twelve) hours. 08/06/16   Deatra Canter, FNP    Family History History reviewed. No pertinent family history.  Social History Social History  Substance Use Topics  . Smoking status: Current Every Day Smoker    Packs/day: 0.50  . Smokeless tobacco: Never Used  . Alcohol use No     Allergies   Hydrocodone-acetaminophen   Review of Systems Review of Systems  Constitutional: Negative for chills and fever.  HENT: Positive for ear discharge and ear pain. Negative for sore throat.   Eyes: Negative for visual disturbance.  Gastrointestinal: Positive for nausea.  Musculoskeletal: Positive for myalgias. Negative for neck pain and neck stiffness.  Neurological: Negative for dizziness, syncope and light-headedness.  Physical Exam Updated Vital Signs BP 127/68 (BP Location: Right Arm)   Pulse 98   Temp 99 F (37.2 C) (Oral)   Resp 20   Ht 5\' 4"  (1.626 m)   Wt 175 lb (79.4 kg)   LMP 11/12/2016   SpO2 98%   BMI 30.04 kg/m   Physical Exam  Constitutional: She is oriented to person, place, and time. She appears well-developed and well-nourished.  HENT:  Head: Normocephalic and atraumatic.  Right Ear: Hearing normal. There is drainage.  Left Ear: Hearing, tympanic membrane, external ear and ear canal normal. No drainage.  Nose: Nose normal.    Mouth/Throat: Uvula is midline, oropharynx is clear and moist and mucous membranes are normal.  Ruptured right TM with associated purulent drainage in the ear canal. Mild erythema of the ear canal. Mild pain with palpation of the right ear. No erythema or edema to the mastoid region. Slight tenderness to palpation with percussion of the right mastoid.  Eyes: Conjunctivae and EOM are normal. Pupils are equal, round, and reactive to light.  Neck: Normal range of motion. Neck supple.  No nuchal rigidity.  Pulmonary/Chest: Effort normal.  Abdominal: She exhibits no distension.  Musculoskeletal: Normal range of motion.  Lymphadenopathy:    She has no cervical adenopathy.  Neurological: She is alert and oriented to person, place, and time.  Skin: Skin is warm and dry. Capillary refill takes less than 2 seconds.  Psychiatric: She has a normal mood and affect.  Nursing note and vitals reviewed.    ED Treatments / Results   DIAGNOSTIC STUDIES: Oxygen Saturation is 98% on RA, normal by my interpretation.   COORDINATION OF CARE: 11:40 PM-Discussed next steps with pt. Pt verbalized understanding and is agreeable with the plan.   Labs (all labs ordered are listed, but only abnormal results are displayed) Labs Reviewed - No data to display  EKG  EKG Interpretation None       Radiology No results found.  Procedures Procedures (including critical care time)  Medications Ordered in ED Medications  amoxicillin-clavulanate (AUGMENTIN) 875-125 MG per tablet 1 tablet (1 tablet Oral Given 12/03/16 0046)     Initial Impression / Assessment and Plan / ED Course  I have reviewed the triage vital signs and the nursing notes.  Pertinent labs & imaging results that were available during my care of the patient were reviewed by me and considered in my medical decision making (see chart for details).     The patient resents to the ED with complaints of right ear pain and drainage for the  past week and a half. On exam patient does have a ruptured eardrum with purulent drainage of the right ear canal. Mild tenderness to palpation of the right mastoid without any erythema or edema. Patient is nontoxic appearing. Vital signs are stable. She is afebrile. No nuchal rigidity concerning for meningitis. Do not have CT scan at this time. The patient will be CT scan of mastoid region given tenderness and history however low suspicion for acute mastoiditis.. Will start patient on Augmentin for otitis media with ruptured eardrum and have scheduled her for a CT scan today and outpatient setting. Have given patient ENT follow-up. Patient was seen and evaluated by Dr. Read Drivers who is agreeable with the above plan.    Final Clinical Impressions(s) / ED Diagnoses   Final diagnoses:  Acute otitis media, unspecified otitis media type  Ruptured ear drum, right    New Prescriptions Discharge Medication List as of  12/03/2016 12:37 AM    START taking these medications   Details  amoxicillin-clavulanate (AUGMENTIN) 875-125 MG tablet Take 1 tablet by mouth 2 (two) times daily., Starting Tue 12/03/2016, Print       I personally performed the services described in this documentation, which was scribed in my presence. The recorded information has been reviewed and is accurate.     Rise MuLeaphart, Annamae Shivley T, PA-C 12/03/16 1525    Molpus, John, MD 12/07/16 1550

## 2016-12-03 ENCOUNTER — Other Ambulatory Visit: Payer: Self-pay | Admitting: Student

## 2016-12-03 ENCOUNTER — Ambulatory Visit (INDEPENDENT_AMBULATORY_CARE_PROVIDER_SITE_OTHER): Payer: Self-pay

## 2016-12-03 ENCOUNTER — Ambulatory Visit (HOSPITAL_BASED_OUTPATIENT_CLINIC_OR_DEPARTMENT_OTHER): Payer: No Typology Code available for payment source

## 2016-12-03 DIAGNOSIS — H7090 Unspecified mastoiditis, unspecified ear: Secondary | ICD-10-CM

## 2016-12-03 DIAGNOSIS — R6884 Jaw pain: Secondary | ICD-10-CM

## 2016-12-03 DIAGNOSIS — H9201 Otalgia, right ear: Secondary | ICD-10-CM

## 2016-12-03 MED ORDER — AMOXICILLIN-POT CLAVULANATE 875-125 MG PO TABS
1.0000 | ORAL_TABLET | Freq: Once | ORAL | Status: AC
  Administered 2016-12-03: 1 via ORAL
  Filled 2016-12-03: qty 1

## 2016-12-03 MED ORDER — AMOXICILLIN-POT CLAVULANATE 875-125 MG PO TABS: 1.0000 | ORAL_TABLET | Freq: Two times a day (BID) | ORAL | 0 refills | Status: DC

## 2016-12-03 NOTE — Discharge Instructions (Signed)
Take antibiotics as prescribed. Make sure you follow up tomorrow for your ct scan. Follow up with ENT.

## 2016-12-04 NOTE — ED Provider Notes (Signed)
6:15 AM  Pt called [facility] requesting the results from her CT scan. It appears patient had a CT of the temporal bones without contrast. Impression shows partial opacification of the right external auditory canal with retraction of the right tympanic membrane possibly indicating otitis media. There is no mastoid effusion or other acute abnormality. Normal appearance of the temporomandibular joints. I have given her these results over the phone. It appears she was diagnosed with otitis media and started on Augmentin which she has taken 2 doses of. She states she woke up this morning feeling worse. I have discussed with patient if she is concerned that there is an emergent issue present she is welcome to return to the emergency department or follow-up with her primary care physician but that I could not give further advice, recommendations over the phone.   Joedy Eickhoff, Layla MawKristen N, DO 12/04/16 [phone]

## 2016-12-26 ENCOUNTER — Encounter: Payer: Self-pay | Admitting: Emergency Medicine

## 2016-12-26 ENCOUNTER — Ambulatory Visit (INDEPENDENT_AMBULATORY_CARE_PROVIDER_SITE_OTHER): Payer: TRICARE For Life (TFL) | Admitting: Emergency Medicine

## 2016-12-26 VITALS — BP 113/77 | HR 95 | Temp 98.8°F | Resp 18 | Ht 64.0 in | Wt 178.6 lb

## 2016-12-26 DIAGNOSIS — M62838 Other muscle spasm: Secondary | ICD-10-CM | POA: Diagnosis not present

## 2016-12-26 DIAGNOSIS — M7918 Myalgia, other site: Secondary | ICD-10-CM | POA: Insufficient documentation

## 2016-12-26 DIAGNOSIS — M791 Myalgia: Secondary | ICD-10-CM | POA: Diagnosis not present

## 2016-12-26 DIAGNOSIS — M79602 Pain in left arm: Secondary | ICD-10-CM | POA: Diagnosis not present

## 2016-12-26 MED ORDER — CYCLOBENZAPRINE HCL 10 MG PO TABS: 10.0000 mg | ORAL_TABLET | Freq: Three times a day (TID) | ORAL | 0 refills | Status: AC | PRN

## 2016-12-26 MED ORDER — DICLOFENAC SODIUM 75 MG PO TBEC: 75.0000 mg | DELAYED_RELEASE_TABLET | Freq: Two times a day (BID) | ORAL | 0 refills | Status: AC

## 2016-12-26 NOTE — Progress Notes (Signed)
Taylor Garcia 26 y.o.   Chief Complaint  Patient presents with  . Arm Pain    x1 1/2 weeks, per pt is having chest pains with left arm pain.    HISTORY OF PRESENT ILLNESS: This is a 26 y.o. female complaining of pain to left arm x 1-2 weeks; denies trauma. Has h/o Chiari malformation C-spine s/p surgery. Denies numbness, tingling, or focal weakness.  HPI   Prior to Admission medications   Not on File    Allergies  Allergen Reactions  . Hydrocodone-Acetaminophen Other (See Comments)    VICODIN Reaction:" vomits Blood"    Patient Active Problem List   Diagnosis Date Noted  . Chiari malformation   . PID (acute pelvic inflammatory disease) 02/18/2012  . Presence of intrauterine contraceptive device (IUD) 02/18/2012  . ANKLE SPRAIN, LEFT 09/22/2009    Past Medical History:  Diagnosis Date  . Back pain   . Bartholin cyst   . Chiari malformation   . ZOXWRUEA(540.9)     Past Surgical History:  Procedure Laterality Date  . EARS     TUBES IN EARS   . EYE SURGERY     AS CHILD   . SUBOCCIPITAL CRANIECTOMY CERVICAL LAMINECTOMY  05/13/2011   Procedure: SUBOCCIPITAL CRANIECTOMY CERVICAL LAMINECTOMY/DURAPLASTY;  Surgeon: Carmela Hurt;  Location: [facility];  Service: Neurosurgery;  Laterality: N/A;  CHIARI DECOMPRESSION    Social History   Social History  . Marital status: Single    Spouse name: N/A  . Number of children: N/A  . Years of education: N/A   Occupational History  . Not on file.   Social History Main Topics  . Smoking status: Current Every Day Smoker    Packs/day: 0.50  . Smokeless tobacco: Never Used  . Alcohol use No  . Drug use: Yes    Types: Marijuana  . Sexual activity: Yes    Birth control/ protection: Implant   Other Topics Concern  . Not on file   Social History Narrative  . No narrative on file    No family history on file.   Review of Systems  Constitutional: Negative.  Negative for chills and fever.  HENT: Negative.     Eyes: Negative.   Respiratory: Negative.  Negative for cough and shortness of breath.   Cardiovascular: Negative for chest pain.  Gastrointestinal: Negative for abdominal pain, diarrhea, nausea and vomiting.  Genitourinary: Negative.   Skin: Negative for rash.  Neurological: Negative for dizziness, tingling, sensory change, speech change, focal weakness, seizures, loss of consciousness and headaches.  Endo/Heme/Allergies: Negative.   All other systems reviewed and are negative.  Vitals:   12/26/16 1428  BP: 113/77  Pulse: 95  Resp: 18  Temp: 98.8 F (37.1 C)     Physical Exam  Constitutional: She is oriented to person, place, and time. She appears well-developed and well-nourished.  HENT:  Head: Normocephalic and atraumatic.  Right Ear: External ear normal.  Left Ear: External ear normal.  Nose: Nose normal.  Mouth/Throat: Oropharynx is clear and moist.  Eyes: Conjunctivae and EOM are normal. Pupils are equal, round, and reactive to light.  Neck: No JVD present. Muscular tenderness present. No spinous process tenderness present. Decreased range of motion present. No erythema present.  Cardiovascular: Normal rate, regular rhythm and normal heart sounds.   Pulmonary/Chest: Effort normal and breath sounds normal.  Abdominal: Soft. There is no tenderness.  Musculoskeletal:       Arms: LUE: NVI; non-tender. Left shoulder: LROM due  to pain. +tenderness with muscle spasm left trapezius. Rest of extremities: WNL.  Lymphadenopathy:    She has no cervical adenopathy.  Neurological: She is alert and oriented to person, place, and time. She displays normal reflexes. No sensory deficit. She exhibits normal muscle tone. Coordination normal.  Skin: Skin is warm and dry. Capillary refill takes less than 2 seconds. No rash noted.  Psychiatric: She has a normal mood and affect. Her behavior is normal.  Vitals reviewed.    ASSESSMENT & PLAN: Taylor Garcia was seen today for arm  pain.  Diagnoses and all orders for this visit:  Left arm pain  Musculoskeletal pain  Trapezius muscle spasm  Other orders -     diclofenac (VOLTAREN) 75 MG EC tablet; Take 1 tablet (75 mg total) by mouth 2 (two) times daily. -     cyclobenzaprine (FLEXERIL) 10 MG tablet; Take 1 tablet (10 mg total) by mouth 3 (three) times daily as needed for muscle spasms.  F/U with Neurologist as scheduled.  Patient Instructions       IF you received an x-ray today, you will receive an invoice from [facility]. Please contact [facility] at [phone] with questions or concerns regarding your invoice.   IF you received labwork today, you will receive an invoice from [facility]. Please contact [facility] at [phone] with questions or concerns regarding your invoice.   Our billing staff will not be able to assist you with questions regarding bills from these companies.  You will be contacted with the lab results as soon as they are available. The fastest way to get your results is to activate your My Chart account. Instructions are located on the last page of this paperwork. If you have not heard from us regarding the results in 2 weeks, please contact this office.     Muscle Cramps and Spasms Muscle cramps and spasms are when muscles tighten by themselves. They usually get better within minutes. Muscle cramps are painful. They are usually stronger and last longer than muscle spasms. Muscle spasms may or may not be painful. They can last a few seconds or much longer. Follow these instructions at home:  Drink enough fluid to keep your pee (urine) clear or pale yellow.  Massage, stretch, and relax the muscle.  If directed, apply heat to tight or tense muscles as often as told by your doctor. Use the heat source that your doctor recommends. ? Place a towel between your skin and the heat source. ? Leave the heat on for 20-30 minutes. ? Take off the heat if your skin turns  bright red. This is especially important if you are unable to feel pain, heat, or cold. You may have a greater risk of getting burned.  If directed, put ice on the affected area. This may help if you are sore or have pain after a cramp or spasm. ? Put ice in a plastic bag. ? Place a towel between your skin and the bag. ? Leave the ice on for 20 minutes, 2-3 times a day.  Take over-the-counter and prescription medicines only as told by your doctor.  Pay attention to any changes in your symptoms. Contact a doctor if:  Your cramps or spasms get worse or happen more often.  Your cramps or spasms do not get better with time. This information is not intended to replace advice given to you by your health care provider. Make sure you discuss any questions you have with your health care provider. Document Released:  05/30/2008 Document Revised: 07/19/2015 Document Reviewed: 03/21/2015 Elsevier Interactive Patient Education  2018 [employer].      Edwina Barth, MD [facility]

## 2016-12-26 NOTE — Patient Instructions (Addendum)
     IF you received an x-ray today, you will receive an invoice from [facility]. Please contact [facility] at [phone] with questions or concerns regarding your invoice.   IF you received labwork today, you will receive an invoice from [facility]. Please contact [facility] at [phone] with questions or concerns regarding your invoice.   Our billing staff will not be able to assist you with questions regarding bills from these companies.  You will be contacted with the lab results as soon as they are available. The fastest way to get your results is to activate your My Chart account. Instructions are located on the last page of this paperwork. If you have not heard from us regarding the results in 2 weeks, please contact this office.     Muscle Cramps and Spasms Muscle cramps and spasms are when muscles tighten by themselves. They usually get better within minutes. Muscle cramps are painful. They are usually stronger and last longer than muscle spasms. Muscle spasms may or may not be painful. They can last a few seconds or much longer. Follow these instructions at home:  Drink enough fluid to keep your pee (urine) clear or pale yellow.  Massage, stretch, and relax the muscle.  If directed, apply heat to tight or tense muscles as often as told by your doctor. Use the heat source that your doctor recommends. ? Place a towel between your skin and the heat source. ? Leave the heat on for 20-30 minutes. ? Take off the heat if your skin turns bright red. This is especially important if you are unable to feel pain, heat, or cold. You may have a greater risk of getting burned.  If directed, put ice on the affected area. This may help if you are sore or have pain after a cramp or spasm. ? Put ice in a plastic bag. ? Place a towel between your skin and the bag. ? Leave the ice on for 20 minutes, 2-3 times a day.  Take over-the-counter and prescription medicines only as  told by your doctor.  Pay attention to any changes in your symptoms. Contact a doctor if:  Your cramps or spasms get worse or happen more often.  Your cramps or spasms do not get better with time. This information is not intended to replace advice given to you by your health care provider. Make sure you discuss any questions you have with your health care provider. Document Released: 05/30/2008 Document Revised: 07/19/2015 Document Reviewed: 03/21/2015 Elsevier Interactive Patient Education  2018 [employer]vier Inc.  

## 2017-07-08 ENCOUNTER — Other Ambulatory Visit: Payer: Self-pay

## 2017-07-08 ENCOUNTER — Encounter (HOSPITAL_COMMUNITY): Payer: Self-pay | Admitting: *Deleted

## 2017-07-08 ENCOUNTER — Inpatient Hospital Stay (HOSPITAL_COMMUNITY)
Admission: AD | Admit: 2017-07-08 | Discharge: 2017-07-08 | Disposition: A | Discharge status: Home / Self Care | Source: Ambulatory Visit | Attending: Obstetrics and Gynecology | Admitting: Obstetrics and Gynecology

## 2017-07-08 DIAGNOSIS — O26891 Other specified pregnancy related conditions, first trimester: Secondary | ICD-10-CM | POA: Diagnosis not present

## 2017-07-08 DIAGNOSIS — F17210 Nicotine dependence, cigarettes, uncomplicated: Secondary | ICD-10-CM | POA: Diagnosis not present

## 2017-07-08 DIAGNOSIS — Z3A01 Less than 8 weeks gestation of pregnancy: Secondary | ICD-10-CM | POA: Insufficient documentation

## 2017-07-08 DIAGNOSIS — Z79899 Other long term (current) drug therapy: Secondary | ICD-10-CM | POA: Diagnosis not present

## 2017-07-08 DIAGNOSIS — O99331 Smoking (tobacco) complicating pregnancy, first trimester: Secondary | ICD-10-CM | POA: Diagnosis not present

## 2017-07-08 DIAGNOSIS — L081 Erythrasma: Secondary | ICD-10-CM | POA: Diagnosis not present

## 2017-07-08 DIAGNOSIS — R21 Rash and other nonspecific skin eruption: Secondary | ICD-10-CM | POA: Diagnosis present

## 2017-07-08 LAB — URINALYSIS, ROUTINE W REFLEX MICROSCOPIC
BILIRUBIN URINE: NEGATIVE
GLUCOSE, UA: NEGATIVE mg/dL
Ketones, ur: NEGATIVE mg/dL
NITRITE: NEGATIVE
PH: 7 (ref 5.0–8.0)
Protein, ur: NEGATIVE mg/dL
SPECIFIC GRAVITY, URINE: 1.003 — AB (ref 1.005–1.030)

## 2017-07-08 LAB — POCT PREGNANCY, URINE: PREG TEST UR: POSITIVE — AB

## 2017-07-08 MED ORDER — CLINDAMYCIN PHOSPHATE 1 % EX GEL: Freq: Two times a day (BID) | CUTANEOUS | 0 refills | Status: AC

## 2017-07-08 NOTE — MAU Note (Signed)
Pt present with c/o rash located under left breast.  Reports rash has been present x1 week.  Reports rash is "bumpy", red, and itchy.  Denies any drainage.

## 2017-07-08 NOTE — MAU Provider Note (Signed)
History     CSN: [id number]  Arrival date and time: 07/08/17 1132   First Provider Initiated Contact with Patient 07/08/17 1307      Chief Complaint  Patient presents with  . Rash  to L brest   HPI  Ms.  Antrice L Crego is a 27 y.o. year old G1P0 female at [redacted]w[redacted]d weeks gestation who presents to MAU reporting a "very itchy" rash under LT breast and itchiness on both legs for a little over 2 wks ago. She describes the rash as bumpy, red and itchy.   Past Medical History:  Diagnosis Date  . Back pain   . Bartholin cyst   . Chiari malformation   . [id number](478.2)     Past Surgical History:  Procedure Laterality Date  . EARS     TUBES IN EARS   . EYE SURGERY     AS CHILD   . SUBOCCIPITAL CRANIECTOMY CERVICAL LAMINECTOMY  05/13/2011   Procedure: SUBOCCIPITAL CRANIECTOMY CERVICAL LAMINECTOMY/DURAPLASTY;  Surgeon: Carmela Hurt;  Location: [facility];  Service: Neurosurgery;  Laterality: N/A;  CHIARI DECOMPRESSION    History reviewed. No pertinent family history.  Social History   Tobacco Use  . Smoking status: Current Every Day Smoker    Packs/day: 0.50    Types: Cigarettes  . Smokeless tobacco: Never Used  Substance Use Topics  . Alcohol use: No  . Drug use: Yes    Types: Marijuana    Comment: last used 2 weeks ago    Allergies:  Allergies  Allergen Reactions  . Hydrocodone-Acetaminophen Other (See Comments)    VICODIN Reaction:" vomits Blood"    Medications Prior to Admission  Medication Sig Dispense Refill Last Dose  . cyclobenzaprine (FLEXERIL) 10 MG tablet Take 1 tablet (10 mg total) by mouth 3 (three) times daily as needed for muscle spasms. 30 tablet 0 Past Month at Unknown time  . Prenatal MV & Min w/FA-DHA (PRENATAL ADULT GUMMY/DHA/FA) 0.4-25 MG CHEW Chew 2 tablets by mouth daily.   07/08/2017 at Unknown time    Review of Systems  Constitutional: Negative.   HENT: Negative.   Eyes: Negative.   Respiratory: Negative.   Cardiovascular: Negative.    Gastrointestinal: Negative.   Endocrine: Negative.   Genitourinary: Negative.   Musculoskeletal: Negative.   Skin: Positive for rash ("very itchy").  Allergic/Immunologic: Negative.   Neurological: Negative.   Hematological: Negative.   Psychiatric/Behavioral: Negative.    Physical Exam   Blood pressure (!) 127/98, pulse (!) 104, temperature 98.2 F (36.8 C), temperature source Oral, resp. rate 20, height 5\' 4"  (1.626 m), weight 180 lb (81.6 kg), last menstrual period 05/31/2017, SpO2 100 %.  Physical Exam  Nursing note and vitals reviewed. Constitutional: She is oriented to person, place, and time. She appears well-developed and well-nourished.  HENT:  Head: Normocephalic.  Eyes: Pupils are equal, round, and reactive to light.  Neck: Normal range of motion.  Cardiovascular: Normal rate, regular rhythm and normal heart sounds.  Respiratory: Effort normal and breath sounds normal.  GI: Soft. Bowel sounds are normal.  Genitourinary:  Genitourinary Comments: Pelvic deferred  Musculoskeletal: Normal range of motion.  Neurological: She is alert and oriented to person, place, and time.  Skin: Skin is warm and dry. Rash (3cm area of erythema and 5-7 old appearing rraised ash bumps; no drainage; located in the fold of the LT breast) noted.  Psychiatric: She has a normal mood and affect. Her behavior is normal. Judgment and thought content normal.  MAU Course  Procedures  MDM CCUA UPT *Consult with Dr. Nira Retortegele @ [phone] - patient also examined by Dr. Nira Retortegele, the differential diagnosis of erythrasma vs HSV was determined. Decision made to treat for erythrasma first.  Results for orders placed or performed during the hospital encounter of 07/08/17 (from the past 72 hour(s))  Urinalysis, Routine w reflex microscopic     Status: Abnormal   Collection Time: 07/08/17 12:00 PM  Result Value Ref Range   Color, Urine STRAW (A) YELLOW   APPearance HAZY (A) CLEAR   Specific Gravity, Urine  1.003 (L) 1.005 - 1.030   pH 7.0 5.0 - 8.0   Glucose, UA NEGATIVE NEGATIVE mg/dL   Hgb urine dipstick MODERATE (A) NEGATIVE   Bilirubin Urine NEGATIVE NEGATIVE   Ketones, ur NEGATIVE NEGATIVE mg/dL   Protein, ur NEGATIVE NEGATIVE mg/dL   Nitrite NEGATIVE NEGATIVE   Leukocytes, UA TRACE (A) NEGATIVE   RBC / HPF 0-5 0 - 5 RBC/hpf   WBC, UA 0-5 0 - 5 WBC/hpf   Bacteria, UA RARE (A) NONE SEEN   Squamous Epithelial / LPF 6-30 (A) NONE SEEN  Pregnancy, urine POC     Status: Abnormal   Collection Time: 07/08/17 12:21 PM  Result Value Ref Range   Preg Test, Ur POSITIVE (A) NEGATIVE    Comment:        THE SENSITIVITY OF THIS METHODOLOGY IS >24 mIU/mL     Assessment and Plan  Erythrasma - Rx for Clindamycin gel BID to affected area - Information provided on skin condition in pregnancy and rash - Advised to return to MAU if condition worsens or any OB emergencies  - Discharge home - Patient verbalized an understanding of the plan of care and agrees.   Raelyn Moraolitta Melaine Mcphee, MSN, CNM 07/08/2017, 1:07 PM

## 2017-07-08 NOTE — Discharge Instructions (Signed)
[facility]  ° ° °[facility]       Phone: [phone] ° °[facility] Phone: [phone] ° °[facility]  Phone: [phone] ° °[facility]  Phone: [phone] ° °[facility]  Phone: [phone] ° °[facility]       Phone: [phone] ° °[facility]    Phone: [phone]  ° °[facility] ([place]    Phone: [phone] ° °[facility]    Phone: [phone] ° °[facility]    Phone: [phone] ° °[facility]    Phone: [phone] ° °[facility]       Phone: [phone]  ° °[facility]  Phone: [phone] ° °[facility]    Phone: [phone] ° °[facility]   Phone: [phone] ° °[facility]      Phone: [phone] ° °[facility]    Phone: [phone] ° °

## 2018-01-03 ENCOUNTER — Other Ambulatory Visit: Payer: Self-pay

## 2018-01-03 ENCOUNTER — Emergency Department (HOSPITAL_BASED_OUTPATIENT_CLINIC_OR_DEPARTMENT_OTHER)
Admission: EM | Admit: 2018-01-03 | Discharge: 2018-01-03 | Disposition: A | Discharge status: Home / Self Care | Attending: Emergency Medicine | Admitting: Emergency Medicine

## 2018-01-03 ENCOUNTER — Encounter (HOSPITAL_BASED_OUTPATIENT_CLINIC_OR_DEPARTMENT_OTHER): Payer: Self-pay | Admitting: Emergency Medicine

## 2018-01-03 DIAGNOSIS — F17210 Nicotine dependence, cigarettes, uncomplicated: Secondary | ICD-10-CM | POA: Insufficient documentation

## 2018-01-03 DIAGNOSIS — Z79899 Other long term (current) drug therapy: Secondary | ICD-10-CM | POA: Insufficient documentation

## 2018-01-03 DIAGNOSIS — O99331 Smoking (tobacco) complicating pregnancy, first trimester: Secondary | ICD-10-CM | POA: Insufficient documentation

## 2018-01-03 DIAGNOSIS — Z3A01 Less than 8 weeks gestation of pregnancy: Secondary | ICD-10-CM | POA: Diagnosis not present

## 2018-01-03 DIAGNOSIS — Z3201 Encounter for pregnancy test, result positive: Secondary | ICD-10-CM | POA: Diagnosis present

## 2018-01-03 LAB — PREGNANCY, URINE: Preg Test, Ur: POSITIVE — AB

## 2018-01-03 NOTE — ED Notes (Signed)
ED Provider at bedside. 

## 2018-01-03 NOTE — Discharge Instructions (Signed)
Your pregnancy test was positive today. Please take a daily prenatal vitamin.  Please avoid NSAIDs such as Advil as well as alcohol.  Please make an appointment with your OB/GYN to use the referral given to schedule only appointment in 1 week.  Please return if you have any feelings that you are going to pass out, abdominal pain, low back pain or vaginal bleeding.

## 2018-01-03 NOTE — ED Notes (Addendum)
ED Provider at bedside for ultrasound. 

## 2018-01-03 NOTE — ED Provider Notes (Signed)
[facility] EMERGENCY DEPARTMENT Provider Note   CSN: [id number] Arrival date & time: 01/03/18  1406     History   Chief Complaint Chief Complaint  Patient presents with  . Possible Pregnancy    HPI Taylor Garcia is a 27 y.o. female with a past medical history of miscarriage (08/2017) who presents emergency department today for pregnancy test.  Patient states that she recently was at her chiropractor and could not get x-rays as she was unsure if she was pregnant.  She took multiple home pregnancy tests but states the lines were faint so she was unsure if it was positive or negative.  She notes that she did take one last week, Sunday, that was negative.  She is coming in today to confirm if she is pregnant or not.  She notes she has no current symptoms.  She denies any fevers, abdominal pain, nausea/vomiting/diarrhea, vaginal bleeding, vaginal discharge, or urinary symptoms.  Patient is in a monogamous relationship with one female partner does not use protection.  She notes that she is trying to get pregnant.  She reports that she does not use any birth control.  She denies history of ectopic.  She reports she did have a miscarriage in February of this year.  Patient notes that nothing makes her symptoms worse.  She cannot rate pain and she does not have any of this current time.  Patient's LMP was on 12/02/2017.  HPI  Past Medical History:  Diagnosis Date  . Back pain   . Bartholin cyst   . Chiari malformation   . HQIONGEX(528.4Headache(784.0)     Patient Active Problem List   Diagnosis Date Noted  . Erythrasma 07/08/2017  . Left arm pain 12/26/2016  . Musculoskeletal pain 12/26/2016  . Trapezius muscle spasm 12/26/2016  . Chiari malformation   . PID (acute pelvic inflammatory disease) 02/18/2012  . Presence of intrauterine contraceptive device (IUD) 02/18/2012  . ANKLE SPRAIN, LEFT 09/22/2009    Past Surgical History:  Procedure Laterality Date  . EARS     TUBES IN EARS   . EYE  SURGERY     AS CHILD   . SUBOCCIPITAL CRANIECTOMY CERVICAL LAMINECTOMY  05/13/2011   Procedure: SUBOCCIPITAL CRANIECTOMY CERVICAL LAMINECTOMY/DURAPLASTY;  Surgeon: Carmela HurtKyle L Cabbell;  Location: [facility];  Service: Neurosurgery;  Laterality: N/A;  CHIARI DECOMPRESSION     OB History    Gravida  1   Para      Term      Preterm      AB      Living        SAB      TAB      Ectopic      Multiple      Live Births               Home Medications    Prior to Admission medications   Medication Sig Start Date End Date Taking? Authorizing Provider  clindamycin (CLINDAGEL) 1 % gel Apply topically 2 (two) times daily. 07/08/17   Raelyn Moraawson, Rolitta, CNM  cyclobenzaprine (FLEXERIL) 10 MG tablet Take 1 tablet (10 mg total) by mouth 3 (three) times daily as needed for muscle spasms. 12/26/16   Georgina QuintSagardia, Miguel Jose, MD  Prenatal MV & Min w/FA-DHA (PRENATAL ADULT GUMMY/DHA/FA) 0.4-25 MG CHEW Chew 2 tablets by mouth daily.    [provider]    Family History No family history on file.  Social History Social History   Tobacco Use  .  Smoking status: Current Every Day Smoker    Packs/day: 0.50    Types: Cigarettes  . Smokeless tobacco: Never Used  Substance Use Topics  . Alcohol use: No  . Drug use: Yes    Types: Marijuana    Comment: last used 2 weeks ago     Allergies   Hydrocodone-acetaminophen   Review of Systems Review of Systems  All other systems reviewed and are negative.    Physical Exam Updated Vital Signs BP 118/75   Pulse (!) 105   Temp 98.7 F (37.1 C) (Oral)   Resp 16   Ht 5\' 4"  (1.626 m)   Wt 83 kg (183 lb)   LMP 12/10/2017 (Approximate)   SpO2 100%   Breastfeeding? Unknown   BMI 31.41 kg/m   Physical Exam  Constitutional: She appears well-developed and well-nourished.  HENT:  Head: Normocephalic and atraumatic.  Right Ear: External ear normal.  Left Ear: External ear normal.  Nose: Nose normal.  Mouth/Throat: Uvula is  midline, oropharynx is clear and moist and mucous membranes are normal. No tonsillar exudate.  Eyes: Pupils are equal, round, and reactive to light. Right eye exhibits no discharge. Left eye exhibits no discharge. No scleral icterus.  Neck: Trachea normal. Neck supple. No spinous process tenderness present. No neck rigidity. Normal range of motion present.  Cardiovascular: Normal rate, regular rhythm and intact distal pulses.  No murmur heard. Pulses:      Radial pulses are 2+ on the right side, and 2+ on the left side.       Dorsalis pedis pulses are 2+ on the right side, and 2+ on the left side.       Posterior tibial pulses are 2+ on the right side, and 2+ on the left side.  No lower extremity swelling or edema. Calves symmetric in size bilaterally.  Pulmonary/Chest: Effort normal and breath sounds normal. She exhibits no tenderness.  Abdominal: Soft. Bowel sounds are normal. She exhibits no distension. There is no tenderness. There is no rigidity, no rebound, no guarding and no CVA tenderness.  Musculoskeletal: She exhibits no edema.  Lymphadenopathy:    She has no cervical adenopathy.  Neurological: She is alert.  Skin: Skin is warm and dry. No rash noted. She is not diaphoretic.  Psychiatric: She has a normal mood and affect.  Nursing note and vitals reviewed.    ED Treatments / Results  Labs (all labs ordered are listed, but only abnormal results are displayed) Labs Reviewed  PREGNANCY, URINE - Abnormal; Notable for the following components:      Result Value   Preg Test, Ur POSITIVE (*)    All other components within normal limits    EKG None  Radiology No results found.  Procedures Procedures (including critical care time)  Medications Ordered in ED Medications - No data to display   Initial Impression / Assessment and Plan / ED Course  I have reviewed the triage vital signs and the nursing notes.  Pertinent labs & imaging results that were available during my  care of the patient were reviewed by me and considered in my medical decision making (see chart for details).     27 y.o. female that is now P2G0 who presents the emergency department today with a positive pregnancy test in the department.  HPI as above.  Patient with mild tachycardia but otherwise normal vital signs.  No hypotension.  She is not complaining of any abdominal pain, vaginal bleeding or low back pain.  No urinary symptoms.  Patient has had a prior miscarriage x 1.  No history of ectopics.  Patient is already on a prenatal vitamin.  She does not need a prescription for this.  States she already has an OB/GYN.  Will give referral to woman's for an as-needed basis.  Stressed importance of following up.  Discussed with patient if she develops any new low back pain, abdominal pain, vaginal bleeding sore feeling like she is faint/going to pass out she needs to present to the emergency department today for formal ultrasound and blood test.  She is without any abdominal pain, vaginal bleeding or low back pain today.  No concern for ectopic.  Discussed avoiding alcohol and NSAIDs.  I advised the patient to follow-up with OBGYN this week. Specific return precautions discussed. Time was given for all questions to be answered. The patient verbalized understanding and agreement with plan. The patient appears safe for discharge home. Patient case discussed with Dr. Deretha Emory who is in agreement with plan.  Final Clinical Impressions(s) / ED Diagnoses   Final diagnoses:  Less than [redacted] weeks gestation of pregnancy    ED Discharge Orders    None       Princella Pellegrini 01/03/18 1528    Vanetta Mulders, MD 01/07/18 [phone]

## 2018-01-03 NOTE — ED Triage Notes (Signed)
Pt here for a pregnancy test. 

## 2018-01-12 ENCOUNTER — Encounter (HOSPITAL_BASED_OUTPATIENT_CLINIC_OR_DEPARTMENT_OTHER): Payer: Self-pay | Admitting: *Deleted

## 2018-01-12 ENCOUNTER — Other Ambulatory Visit: Payer: Self-pay

## 2018-01-12 ENCOUNTER — Emergency Department (HOSPITAL_BASED_OUTPATIENT_CLINIC_OR_DEPARTMENT_OTHER)
Admission: EM | Admit: 2018-01-12 | Discharge: 2018-01-12 | Disposition: A | Discharge status: Home / Self Care | Attending: Emergency Medicine | Admitting: Emergency Medicine

## 2018-01-12 DIAGNOSIS — O208 Other hemorrhage in early pregnancy: Secondary | ICD-10-CM | POA: Insufficient documentation

## 2018-01-12 DIAGNOSIS — O9989 Other specified diseases and conditions complicating pregnancy, childbirth and the puerperium: Secondary | ICD-10-CM | POA: Diagnosis not present

## 2018-01-12 DIAGNOSIS — Z79899 Other long term (current) drug therapy: Secondary | ICD-10-CM | POA: Diagnosis not present

## 2018-01-12 DIAGNOSIS — R300 Dysuria: Secondary | ICD-10-CM | POA: Diagnosis not present

## 2018-01-12 DIAGNOSIS — R109 Unspecified abdominal pain: Secondary | ICD-10-CM | POA: Insufficient documentation

## 2018-01-12 DIAGNOSIS — O99331 Smoking (tobacco) complicating pregnancy, first trimester: Secondary | ICD-10-CM | POA: Insufficient documentation

## 2018-01-12 DIAGNOSIS — Z3491 Encounter for supervision of normal pregnancy, unspecified, first trimester: Secondary | ICD-10-CM | POA: Diagnosis not present

## 2018-01-12 DIAGNOSIS — Z3A01 Less than 8 weeks gestation of pregnancy: Secondary | ICD-10-CM | POA: Diagnosis not present

## 2018-01-12 DIAGNOSIS — F17210 Nicotine dependence, cigarettes, uncomplicated: Secondary | ICD-10-CM | POA: Diagnosis not present

## 2018-01-12 DIAGNOSIS — Z3490 Encounter for supervision of normal pregnancy, unspecified, unspecified trimester: Secondary | ICD-10-CM

## 2018-01-12 LAB — URINALYSIS, ROUTINE W REFLEX MICROSCOPIC
Bilirubin Urine: NEGATIVE
GLUCOSE, UA: NEGATIVE mg/dL
Hgb urine dipstick: NEGATIVE
Ketones, ur: NEGATIVE mg/dL
Leukocytes, UA: NEGATIVE
Nitrite: NEGATIVE
PH: 6 (ref 5.0–8.0)
Protein, ur: NEGATIVE mg/dL
SPECIFIC GRAVITY, URINE: 1.015 (ref 1.005–1.030)

## 2018-01-12 LAB — HCG, QUANTITATIVE, PREGNANCY: hCG, Beta Chain, Quant, S: 16476 m[IU]/mL — ABNORMAL HIGH (ref <5)

## 2018-01-12 NOTE — ED Provider Notes (Signed)
[facility] EMERGENCY DEPARTMENT Provider Note   CSN: [id number] Arrival date & time: 01/12/18  1339     History   Chief Complaint Chief Complaint  Patient presents with  . Vaginal Bleeding  . Dysuria    HPI Taylor Garcia is a 27 y.o. female.  Patient is approximately [redacted] weeks pregnant.  Being followed by [facility] service.  Seen by them last week.  Did have 3 quantitative hCGs last week with showing increase in the pregnancy hormone number.  Also had an ultrasound done on Friday that showed IUP gestational sac with yolk sac but no fetal pole yet.  Our records here show that patient's ABO status is O+.  Patient states that she has had a little bit of vaginal bleeding after intercourse 2 days ago.  But no bleeding now.  Also has some concern about urinary tract infection.  She has a little bit of dysuria.  Patient is taking her prenatal vitamins.  No significant abdominal pain.  No active vaginal bleeding now.     Past Medical History:  Diagnosis Date  . Back pain   . Bartholin cyst   . Chiari malformation   . BJYNWGNF(621.3Headache(784.0)     Patient Active Problem List   Diagnosis Date Noted  . Erythrasma 07/08/2017  . Left arm pain 12/26/2016  . Musculoskeletal pain 12/26/2016  . Trapezius muscle spasm 12/26/2016  . Chiari malformation   . PID (acute pelvic inflammatory disease) 02/18/2012  . Presence of intrauterine contraceptive device (IUD) 02/18/2012  . ANKLE SPRAIN, LEFT 09/22/2009    Past Surgical History:  Procedure Laterality Date  . EARS     TUBES IN EARS   . EYE SURGERY     AS CHILD   . SUBOCCIPITAL CRANIECTOMY CERVICAL LAMINECTOMY  05/13/2011   Procedure: SUBOCCIPITAL CRANIECTOMY CERVICAL LAMINECTOMY/DURAPLASTY;  Surgeon: Carmela HurtKyle L Cabbell;  Location: [facility];  Service: Neurosurgery;  Laterality: N/A;  CHIARI DECOMPRESSION     OB History    Gravida  2   Para      Term      Preterm      AB      Living        SAB      TAB      Ectopic      Multiple      Live Births               Home Medications    Prior to Admission medications   Medication Sig Start Date End Date Taking? Authorizing Provider  Prenatal MV & Min w/FA-DHA (PRENATAL ADULT GUMMY/DHA/FA) 0.4-25 MG CHEW Chew 2 tablets by mouth daily.   Yes [provider]  clindamycin (CLINDAGEL) 1 % gel Apply topically 2 (two) times daily. 07/08/17   Raelyn Moraawson, Rolitta, CNM  cyclobenzaprine (FLEXERIL) 10 MG tablet Take 1 tablet (10 mg total) by mouth 3 (three) times daily as needed for muscle spasms. 12/26/16   Georgina QuintSagardia, Miguel Jose, MD    Family History No family history on file.  Social History Social History   Tobacco Use  . Smoking status: Current Every Day Smoker    Packs/day: 0.50    Types: Cigarettes  . Smokeless tobacco: Never Used  Substance Use Topics  . Alcohol use: No  . Drug use: Yes    Types: Marijuana    Comment: last used 2 weeks ago     Allergies   Hydrocodone-acetaminophen   Review of Systems Review of Systems  Constitutional: Negative for fever.  HENT: Negative for congestion.   Eyes: Negative for redness.  Respiratory: Negative for shortness of breath.   Cardiovascular: Negative for chest pain.  Gastrointestinal: Negative for abdominal pain.  Genitourinary: Positive for dysuria and vaginal bleeding. Negative for pelvic pain.  Musculoskeletal: Negative for back pain.  Skin: Negative for rash.  Neurological: Negative for syncope.  Hematological: Does not bruise/bleed easily.  Psychiatric/Behavioral: Negative for confusion.     Physical Exam Updated Vital Signs BP 113/70 (BP Location: Left Arm)   Pulse 79   Temp 99 F (37.2 C) (Oral)   Resp 16   Ht 1.626 m (5\' 4" )   Wt 81.6 kg (180 lb)   LMP 12/05/2017   SpO2 98%   BMI 30.90 kg/m   Physical Exam  Constitutional: She is oriented to person, place, and time. She appears well-developed and well-nourished. No distress.  HENT:  Head: Normocephalic and  atraumatic.  Mouth/Throat: Oropharynx is clear and moist.  Eyes: Pupils are equal, round, and reactive to light. Conjunctivae and EOM are normal.  Neck: Normal range of motion. Neck supple.  Cardiovascular: Normal rate, regular rhythm and normal heart sounds.  Pulmonary/Chest: Effort normal and breath sounds normal. No respiratory distress.  Abdominal: Soft. Bowel sounds are normal. There is tenderness.  Musculoskeletal: She exhibits no edema.  Neurological: She is alert and oriented to person, place, and time. No cranial nerve deficit or sensory deficit. She exhibits normal muscle tone. Coordination normal.  Skin: Skin is warm.  Nursing note and vitals reviewed.    ED Treatments / Results  Labs (all labs ordered are listed, but only abnormal results are displayed) Labs Reviewed  URINALYSIS, ROUTINE W REFLEX MICROSCOPIC - Abnormal; Notable for the following components:      Result Value   APPearance HAZY (*)    All other components within normal limits  HCG, QUANTITATIVE, PREGNANCY    EKG None  Radiology No results found.  Procedures Procedures (including critical care time)  Medications Ordered in ED Medications - No data to display   Initial Impression / Assessment and Plan / ED Course  I have reviewed the triage vital signs and the nursing notes.  Pertinent labs & imaging results that were available during my care of the patient were reviewed by me and considered in my medical decision making (see chart for details).     As stated above patient does have a baseline quantitative hCG from Friday at 3700.  Also had an ultrasound showing IUP sac with yolk sac.  Patient I think was interested in having another ultrasound but is not indicated.  Would not really show anything different from what they found on Friday.  Patient's Rh is positive.  Her blood type is O+ so no RhoGam required.  Patient has follow-up with her OB/GYN this week.  We did do a quantitative hCG for  comparison to help them out.  Patient's urinalysis here negative for urinary tract infection.  Patient with no further bleeding patient with no pelvic or abdominal pain.  Repeat vaginal exam not indicated.  Final Clinical Impressions(s) / ED Diagnoses   Final diagnoses:  Pregnancy at early stage    ED Discharge Orders    None       Vanetta Mulders, MD 01/12/18 [id number]

## 2018-01-12 NOTE — ED Triage Notes (Signed)
Vaginal bleeding after having intercourse 3 days ago. She is [redacted] weeks pregnant. No bleeding the next day. She has dysuria.

## 2018-01-12 NOTE — Discharge Instructions (Addendum)
Keep your appointment with your OB/GYN for this week.  We did do a another quantitative hCG today which you can see the results on probably by tomorrow on my chart or your OB can follow them up.  This will be very helpful to compare to your quantitative hCG from Friday.  Urinalysis here today with no evidence of urinary tract infection.  Continue your prenatal vitamins.

## 2018-02-09 ENCOUNTER — Encounter (HOSPITAL_COMMUNITY): Payer: Self-pay | Admitting: *Deleted

## 2018-02-09 ENCOUNTER — Inpatient Hospital Stay (HOSPITAL_COMMUNITY)
Admission: AD | Admit: 2018-02-09 | Discharge: 2018-02-09 | Disposition: A | Discharge status: Home / Self Care | Source: Ambulatory Visit | Attending: Obstetrics and Gynecology | Admitting: Obstetrics and Gynecology

## 2018-02-09 DIAGNOSIS — O99331 Smoking (tobacco) complicating pregnancy, first trimester: Secondary | ICD-10-CM | POA: Insufficient documentation

## 2018-02-09 DIAGNOSIS — Z885 Allergy status to narcotic agent status: Secondary | ICD-10-CM | POA: Insufficient documentation

## 2018-02-09 DIAGNOSIS — R109 Unspecified abdominal pain: Secondary | ICD-10-CM

## 2018-02-09 DIAGNOSIS — Z3A09 9 weeks gestation of pregnancy: Secondary | ICD-10-CM | POA: Insufficient documentation

## 2018-02-09 DIAGNOSIS — R102 Pelvic and perineal pain: Secondary | ICD-10-CM | POA: Insufficient documentation

## 2018-02-09 DIAGNOSIS — O26891 Other specified pregnancy related conditions, first trimester: Secondary | ICD-10-CM | POA: Diagnosis not present

## 2018-02-09 DIAGNOSIS — F17210 Nicotine dependence, cigarettes, uncomplicated: Secondary | ICD-10-CM | POA: Diagnosis not present

## 2018-02-09 DIAGNOSIS — O26899 Other specified pregnancy related conditions, unspecified trimester: Secondary | ICD-10-CM

## 2018-02-09 LAB — URINALYSIS, ROUTINE W REFLEX MICROSCOPIC
BILIRUBIN URINE: NEGATIVE
GLUCOSE, UA: NEGATIVE mg/dL
HGB URINE DIPSTICK: NEGATIVE
KETONES UR: NEGATIVE mg/dL
Leukocytes, UA: NEGATIVE
Nitrite: NEGATIVE
PH: 8 (ref 5.0–8.0)
Protein, ur: NEGATIVE mg/dL
Specific Gravity, Urine: 1.005 (ref 1.005–1.030)

## 2018-02-09 NOTE — MAU Provider Note (Signed)
History     CSN: [id number]  Arrival date and time: 02/09/18 1035   First Provider Initiated Contact with Patient 02/09/18 1328      Chief Complaint  Patient presents with  . Abdominal Pain   HPI  Ms. Rosalee KaufmanSkylar Gaba is a 27 y.o. Caucasian [id number] female at 6548w3d complaining of general pelvic pain onset this morning. She states the pain began after she chased her dog a block away from her house. She did not fall or sustain any trauma. She denies bleeding, discharge, contractions, dizziness, fatigue, or any other complaints. She states [facility] Tuesday without any issues and present fetal heart tones.   OB History    Gravida  2   Para      Term      Preterm      AB  1   Living        SAB  1   TAB      Ectopic      Multiple      Live Births              Past Medical History:  Diagnosis Date  . Back pain   . Bartholin cyst   . Chiari malformation   . WUJWJXBJ(478.2Headache(784.0)     Past Surgical History:  Procedure Laterality Date  . EARS     TUBES IN EARS   . EYE SURGERY     AS CHILD   . SUBOCCIPITAL CRANIECTOMY CERVICAL LAMINECTOMY  05/13/2011   Procedure: SUBOCCIPITAL CRANIECTOMY CERVICAL LAMINECTOMY/DURAPLASTY;  Surgeon: Carmela HurtKyle L Cabbell;  Location: [facility];  Service: Neurosurgery;  Laterality: N/A;  CHIARI DECOMPRESSION    No family history on file.  Social History   Tobacco Use  . Smoking status: Current Every Day Smoker    Packs/day: 0.50    Types: Cigarettes  . Smokeless tobacco: Never Used  Substance Use Topics  . Alcohol use: No  . Drug use: Yes    Types: Marijuana    Comment: last used 2 weeks ago    Allergies:  Allergies  Allergen Reactions  . Hydrocodone-Acetaminophen Other (See Comments)    VICODIN Reaction:" vomits Blood"    Medications Prior to Admission  Medication Sig Dispense Refill Last Dose  . clindamycin (CLINDAGEL) 1 % gel Apply topically 2 (two) times daily. 30 g 0   . cyclobenzaprine  (FLEXERIL) 10 MG tablet Take 1 tablet (10 mg total) by mouth 3 (three) times daily as needed for muscle spasms. 30 tablet 0 Past Month at Unknown time  . Prenatal MV & Min w/FA-DHA (PRENATAL ADULT GUMMY/DHA/FA) 0.4-25 MG CHEW Chew 2 tablets by mouth daily.   07/08/2017 at Unknown time    Review of Systems  Constitutional: Negative for chills, diaphoresis, fatigue and fever.  Eyes: Negative for visual disturbance.  Respiratory: Negative for apnea, chest tightness and shortness of breath.   Cardiovascular: Negative for chest pain, palpitations and leg swelling.  Gastrointestinal: Negative for abdominal distention, constipation, diarrhea, nausea and vomiting.  Genitourinary: Positive for pelvic pain. Negative for difficulty urinating, dysuria, vaginal bleeding and vaginal discharge.  Neurological: Negative for dizziness, weakness and light-headedness.   Physical Exam   Blood pressure 117/71, pulse 88, temperature (!) 97.5 F (36.4 C), temperature source Oral, resp. rate 18, height 5\' 4"  (1.626 m), weight 80.7 kg, last menstrual period 12/10/2017, unknown if currently breastfeeding.  Physical Exam  Constitutional: She is oriented to person, place, and time. She appears  well-developed and well-nourished.  HENT:  Head: Normocephalic and atraumatic.  Cardiovascular: Normal rate and regular rhythm.  Respiratory: Effort normal and breath sounds normal. No respiratory distress. She has no wheezes. She has no rales.  GI: Soft. Bowel sounds are normal. She exhibits no distension and no mass. There is no tenderness. There is no rebound and no guarding.  Genitourinary: Vagina normal and uterus normal. Uterus is not tender. Cervix exhibits no motion tenderness, no discharge and no friability. Right adnexum displays no mass, no tenderness and no fullness. Left adnexum displays no mass, no tenderness and no fullness. No tenderness or bleeding in the vagina. No signs of injury around the vagina. No vaginal  discharge found.  Genitourinary Comments: Cervical os closed  Musculoskeletal: Normal range of motion.  Neurological: She is alert and oriented to person, place, and time.  Skin: Skin is warm and dry. No erythema. No pallor.  Psychiatric: She has a normal mood and affect. Her behavior is normal.    MAU Course   MDM Patient had reassuring routine prenatal visit with fetal heart sounds present and negative STI screens. Cervical exam revealed closed os, no bleeding, no discharge. Likely strain while chasing dog.  Assessment and Plan  Pelvic pain  - Tylenol PRN  - return for new or worsening symptoms  Margarita RanaHarrison D Ketzia Guzek 02/09/2018, 1:49 PM

## 2018-02-09 NOTE — Discharge Instructions (Signed)

## 2018-02-09 NOTE — MAU Note (Signed)
Pt states she had to run after her dog this morning, is now having lower abd pain.  Denies pain.

## 2018-02-09 NOTE — MAU Provider Note (Signed)
History     CSN: [id number]  Arrival date and time: 02/09/18 1035   First Provider Initiated Contact with Patient 02/09/18 1328      Chief Complaint  Patient presents with  . Abdominal Pain   HPI  Ms.  Taylor Garcia is a 27 y.o. year old [id number] female at 284w3d weeks gestation who presents to MAU reporting lower abdominal pain after running after her dog this morning. She denies falling or undergoing any trauma. She only noticed lower abdominal cramping/pain after the running. She denies VB/spotting, N/V/D, dizziness blurry vision, or H/A. [facility], but decided to come here because her dad works down the street. She denies VB or LOF. No complaints of abnormal vaginal d/c.  Past Medical History:  Diagnosis Date  . Back pain   . Bartholin cyst   . Chiari malformation   . BJYNWGNF(621.3Headache(784.0)     Past Surgical History:  Procedure Laterality Date  . EARS     TUBES IN EARS   . EYE SURGERY     AS CHILD   . SUBOCCIPITAL CRANIECTOMY CERVICAL LAMINECTOMY  05/13/2011   Procedure: SUBOCCIPITAL CRANIECTOMY CERVICAL LAMINECTOMY/DURAPLASTY;  Surgeon: Carmela HurtKyle L Cabbell;  Location: [facility];  Service: Neurosurgery;  Laterality: N/A;  CHIARI DECOMPRESSION    No family history on file.  Social History   Tobacco Use  . Smoking status: Current Every Day Smoker    Packs/day: 0.50    Types: Cigarettes  . Smokeless tobacco: Never Used  Substance Use Topics  . Alcohol use: No  . Drug use: Yes    Types: Marijuana    Comment: last used 2 weeks ago    Allergies:  Allergies  Allergen Reactions  . Hydrocodone-Acetaminophen Other (See Comments)    VICODIN Reaction:" vomits Blood"    Medications Prior to Admission  Medication Sig Dispense Refill Last Dose  . clindamycin (CLINDAGEL) 1 % gel Apply topically 2 (two) times daily. 30 g 0   . cyclobenzaprine (FLEXERIL) 10 MG tablet Take 1 tablet (10 mg total) by mouth 3 (three) times daily as needed for  muscle spasms. 30 tablet 0 Past Month at Unknown time  . Prenatal MV & Min w/FA-DHA (PRENATAL ADULT GUMMY/DHA/FA) 0.4-25 MG CHEW Chew 2 tablets by mouth daily.   07/08/2017 at Unknown time    Review of Systems  Constitutional: Negative.   HENT: Negative.   Eyes: Negative.   Respiratory: Negative.   Cardiovascular: Negative.   Gastrointestinal: Negative.   Endocrine: Negative.   Genitourinary: Positive for pelvic pain. Negative for vaginal bleeding and vaginal discharge. Menstrual problem: lower cramping.  Musculoskeletal: Negative.   Allergic/Immunologic: Negative.   Neurological: Negative.   Hematological: Negative.   Psychiatric/Behavioral: Negative.    Physical Exam   Blood pressure 117/71, pulse 88, temperature (!) 97.5 F (36.4 C), temperature source Oral, resp. rate 18, height 5\' 4"  (1.626 m), weight 80.7 kg, last menstrual period 12/10/2017.  Physical Exam  Nursing note and vitals reviewed. Constitutional: She is oriented to person, place, and time. She appears well-developed and well-nourished.  HENT:  Head: Normocephalic and atraumatic.  Eyes: Pupils are equal, round, and reactive to light.  Neck: Normal range of motion.  Cardiovascular: Normal rate, regular rhythm and normal heart sounds.  Respiratory: Effort normal and breath sounds normal.  GI: Soft. Bowel sounds are normal.  Genitourinary:  Genitourinary Comments: VE: cervix closed/thick/high  Musculoskeletal: Normal range of motion.  Neurological: She is alert and oriented  to person, place, and time.  Skin: Skin is warm and dry.  Psychiatric: She has a normal mood and affect. Her behavior is normal. Judgment and thought content normal.    MAU Course  Procedures  MDM CCUA  Results for orders placed or performed during the hospital encounter of 02/09/18 (from the past 72 hour(s))  Urinalysis, Routine w reflex microscopic     Status: None   Collection Time: 02/09/18 11:50 AM  Result Value Ref Range    Color, Urine YELLOW YELLOW   APPearance CLEAR CLEAR   Specific Gravity, Urine 1.005 1.005 - 1.030   pH 8.0 5.0 - 8.0   Glucose, UA NEGATIVE NEGATIVE mg/dL   Hgb urine dipstick NEGATIVE NEGATIVE   Bilirubin Urine NEGATIVE NEGATIVE   Ketones, ur NEGATIVE NEGATIVE mg/dL   Protein, ur NEGATIVE NEGATIVE mg/dL   Nitrite NEGATIVE NEGATIVE   Leukocytes, UA NEGATIVE NEGATIVE    Comment: Performed at [facility], [address] [place], [place] [address]    Assessment and Plan  Abdominal pain affecting pregnancy  - Reassurance given that muscles could have possibly been strained during the running, but no s/s of trauma to pregnancy - Bleeding precautions reviewed - Advised to contact OB/GYN for further concerns - Discharge home - Patient verbalized an understanding of the plan of care and agrees.   Raelyn Moraolitta Lorna Strother, MSN, CNM 02/09/2018, 1:44 PM

## 2018-02-09 NOTE — Progress Notes (Signed)
Signature for patient and I did not take in system. Both signatures were signed by patient and I.  Adah Perlhandra Jeanetta Alonzo RN

## 2018-08-01 ENCOUNTER — Encounter (HOSPITAL_COMMUNITY): Payer: Self-pay | Admitting: Emergency Medicine

## 2018-08-01 ENCOUNTER — Inpatient Hospital Stay (HOSPITAL_COMMUNITY)
Admission: AD | Admit: 2018-08-01 | Discharge: 2018-08-01 | Disposition: A | Discharge status: Home / Self Care | Source: Ambulatory Visit | Attending: Obstetrics and Gynecology | Admitting: Obstetrics and Gynecology

## 2018-08-01 DIAGNOSIS — Z3689 Encounter for other specified antenatal screening: Secondary | ICD-10-CM

## 2018-08-01 DIAGNOSIS — Z3A34 34 weeks gestation of pregnancy: Secondary | ICD-10-CM | POA: Diagnosis not present

## 2018-08-01 DIAGNOSIS — F17210 Nicotine dependence, cigarettes, uncomplicated: Secondary | ICD-10-CM | POA: Insufficient documentation

## 2018-08-01 DIAGNOSIS — O368190 Decreased fetal movements, unspecified trimester, not applicable or unspecified: Secondary | ICD-10-CM

## 2018-08-01 DIAGNOSIS — O368130 Decreased fetal movements, third trimester, not applicable or unspecified: Secondary | ICD-10-CM | POA: Diagnosis present

## 2018-08-01 DIAGNOSIS — O99333 Smoking (tobacco) complicating pregnancy, third trimester: Secondary | ICD-10-CM | POA: Insufficient documentation

## 2018-08-01 NOTE — MAU Note (Signed)
Baby has not been moving like normal today. Denies LOF or bleeding. Hx short cervix. Had u/s yesterday and cervical length good. SVE was FT. PNC is at [facility] in [facility]

## 2018-08-01 NOTE — Discharge Instructions (Signed)

## 2018-08-01 NOTE — MAU Provider Note (Signed)
History     CSN: [id number]  Arrival date and time: 08/01/18 1945   None     Chief Complaint  Patient presents with  . Decreased Fetal Movement   HPI   Taylor Garcia is a 28 y.o. female G2P0010 @ [redacted]w[redacted]d here in MAU with complaints of decreased fetal movement today. Says she has felt her baby move, however not as much as she normally moves. Since her arrival to MAU she has felt the baby move several times.   OB History    Gravida  2   Para      Term      Preterm      AB  1   Living  0     SAB  1   TAB      Ectopic      Multiple      Live Births              Past Medical History:  Diagnosis Date  . Back pain   . Bartholin cyst   . Chiari malformation   . CHYIFOYD(741.2)     Past Surgical History:  Procedure Laterality Date  . EARS     TUBES IN EARS   . EYE SURGERY     AS CHILD   . SUBOCCIPITAL CRANIECTOMY CERVICAL LAMINECTOMY  05/13/2011   Procedure: SUBOCCIPITAL CRANIECTOMY CERVICAL LAMINECTOMY/DURAPLASTY;  Surgeon: Carmela Hurt;  Location: [facility];  Service: Neurosurgery;  Laterality: N/A;  CHIARI DECOMPRESSION    History reviewed. No pertinent family history.  Social History   Tobacco Use  . Smoking status: Current Every Day Smoker    Packs/day: 0.50    Types: Cigarettes  . Smokeless tobacco: Never Used  Substance Use Topics  . Alcohol use: No  . Drug use: Yes    Types: Marijuana    Comment: last used 2 weeks ago    Allergies:  Allergies  Allergen Reactions  . Hydrocodone-Acetaminophen Other (See Comments)    VICODIN Reaction:" vomits Blood"    No medications prior to admission.   No results found for this or any previous visit (from the past 48 hour(s)).  Review of Systems  Constitutional: Negative for fatigue.  Gastrointestinal: Negative for abdominal pain.  Genitourinary: Negative for vaginal bleeding and vaginal discharge.   Physical Exam   Blood pressure (!) 101/48, pulse 87, temperature 97.8 F (36.6  C), resp. rate 18, height 5\' 4"  (1.626 m), weight 93 kg, last menstrual period 12/10/2017, unknown if currently breastfeeding.  Physical Exam  Constitutional: She is oriented to person, place, and time. She appears well-developed and well-nourished. No distress.  HENT:  Head: Normocephalic.  Eyes: Pupils are equal, round, and reactive to light.  Neck: Neck supple.  Musculoskeletal: Normal range of motion.  Neurological: She is alert and oriented to person, place, and time.  Skin: Skin is warm. She is not diaphoretic.  Psychiatric: Her behavior is normal.   Fetal Tracing: Baseline: 120 bpm Variability: Moderate  Accelerations: 15x15 Decelerations: None Toco: Quiet   MAU Course  Procedures  None  MDM  NST reactive. Patient feeling normal movements while on her right side drinking cold sprite.   Assessment and Plan   A:  1. Decreased fetal movement during pregnancy, antepartum, single or unspecified fetus   2. NST (non-stress test) reactive     P:  Discharge home in stable condition Return to MAU if symptoms worsen Fetal kick counts discussed  Darwin Rothlisberger, Harolyn Rutherford, NP 08/01/2018 9:22  PM

## 2018-12-04 ENCOUNTER — Encounter (HOSPITAL_COMMUNITY): Payer: Self-pay

## 2024-06-18 ENCOUNTER — Emergency Department (HOSPITAL_BASED_OUTPATIENT_CLINIC_OR_DEPARTMENT_OTHER)
Admission: EM | Admit: 2024-06-18 | Discharge: 2024-06-18 | Disposition: A | Discharge status: Home / Self Care | Attending: Emergency Medicine | Admitting: Emergency Medicine

## 2024-06-18 ENCOUNTER — Emergency Department (HOSPITAL_BASED_OUTPATIENT_CLINIC_OR_DEPARTMENT_OTHER)

## 2024-06-18 ENCOUNTER — Encounter (HOSPITAL_BASED_OUTPATIENT_CLINIC_OR_DEPARTMENT_OTHER): Payer: Self-pay | Admitting: Emergency Medicine

## 2024-06-18 ENCOUNTER — Other Ambulatory Visit: Payer: Self-pay

## 2024-06-18 DIAGNOSIS — J101 Influenza due to other identified influenza virus with other respiratory manifestations: Secondary | ICD-10-CM | POA: Insufficient documentation

## 2024-06-18 LAB — RESP PANEL BY RT-PCR (RSV, FLU A&B, COVID)  RVPGX2
Influenza A by PCR: NEGATIVE
Influenza B by PCR: POSITIVE — AB
Resp Syncytial Virus by PCR: NEGATIVE
SARS Coronavirus 2 by RT PCR: NEGATIVE

## 2024-06-18 LAB — GROUP A STREP BY PCR: Group A Strep by PCR: NOT DETECTED

## 2024-06-18 NOTE — ED Triage Notes (Signed)
 Pt in with cough and flu-like symptoms x 1 wk. States she's had daily fevers since Monday. Last Tylenol  last night. Denies any n/v/d

## 2024-06-18 NOTE — Discharge Instructions (Signed)
Tylenol every 4 hours.  Return if any problems.  

## 2024-06-18 NOTE — ED Provider Notes (Signed)
 " [facility] Provider Note   CSN: [id number] Arrival date & time: 06/18/24  1416     Patient presents with: Cough   Taylor Garcia is a 33 y.o. female.   Patient complains of a cough and bodyaches.  Patient reports she has been experiencing the symptoms for the past 5 days.  Patient reports symptoms began on Monday and have progressively worsened.  Patient reports she has had a fever on and off.  Patient complains of cough and congestion.  Patient denies any nausea or vomiting.  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 No language interpreter was used.  Cough      Prior to Admission medications  Medication Sig Start Date End Date Taking? Authorizing Provider  clindamycin  (CLINDAGEL) 1 % gel Apply topically 2 (two) times daily. 07/08/17   Dawson, Rolitta, CNM  cyclobenzaprine  (FLEXERIL ) 10 MG tablet Take 1 tablet (10 mg total) by mouth 3 (three) times daily as needed for muscle spasms. 12/26/16   Purcell Emil Schanz, MD  Prenatal MV & Min w/FA-DHA (PRENATAL ADULT GUMMY/DHA/FA) 0.4-25 MG CHEW Chew 2 tablets by mouth daily.    [provider]    Allergies: Hydrocodone -acetaminophen     Review of Systems  Respiratory:  Positive for cough.   All other systems reviewed and are negative.   Updated Vital Signs BP 112/69   Pulse 98   Temp 99.4 F (37.4 C) (Oral)   Resp 20   Wt 93 kg   LMP 06/09/2024   SpO2 99%   BMI 35.19 kg/m   Physical Exam Vitals and nursing note reviewed.  Constitutional:      Appearance: She is well-developed.  HENT:     Head: Normocephalic.  Cardiovascular:     Rate and Rhythm: Normal rate.  Pulmonary:     Effort: Pulmonary effort is normal.  Abdominal:     General: There is no distension.  Musculoskeletal:        General: Normal range of motion.     Cervical back: Normal range of motion.  Skin:    General: Skin is  warm.  Neurological:     General: No focal deficit present.     Mental Status: She is alert and oriented to person, place, and time.     (all labs ordered are listed, but only abnormal results are displayed) Labs Reviewed  RESP PANEL BY RT-PCR (RSV, FLU A&B, COVID)  [id number] - Abnormal; Notable for the following components:      Result Value   Influenza B by PCR POSITIVE (*)    All other components within normal limits  GROUP A STREP BY PCR    EKG: None  Radiology: No results found.   Procedures   Medications Ordered in the ED - No data to display                                  Medical Decision Making Patient complains of cough congestion and fevers  Amount and/or Complexity of Data Reviewed Labs: ordered. Decision-making details documented in ED Course.    Details: Labs ordered reviewed and interpreted influenza B is positive  Risk OTC drugs. Risk Details: Patient is counseled on influenza.  She is encouraged to drink plenty of fluids she is advised to take Tylenol  for discomfort patient is  discharged in stable condition        Final diagnoses:  Influenza B    ED Discharge Orders     None      An After Visit Summary was printed and given to the patient.     Flint Sonny POUR, [place] 06/18/24 1927    Yolande Lamar BROCKS, MD 06/26/24 [phone]  "

## 2024-06-18 NOTE — ED Notes (Signed)
 Per Pt. She has not felt good all week.  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
